# Patient Record
Sex: Female | Born: 1975 | Race: Black or African American | Hispanic: No | Marital: Single | Smoking: Never smoker
Health system: Southern US, Community
[De-identification: ages and names within clinical notes are randomized; demographics above are authoritative.]

## PROBLEM LIST (undated history)

## (undated) DIAGNOSIS — D649 Anemia, unspecified: Secondary | ICD-10-CM

## (undated) DIAGNOSIS — R51 Headache: Secondary | ICD-10-CM

## (undated) DIAGNOSIS — D500 Iron deficiency anemia secondary to blood loss (chronic): Principal | ICD-10-CM

## (undated) HISTORY — DX: Iron deficiency anemia secondary to blood loss (chronic): D50.0

## (undated) HISTORY — PX: NO PAST SURGERIES: SHX2092

---

## 2000-03-05 ENCOUNTER — Encounter: Admission: RE | Admit: 2000-03-05 | Discharge: 2000-03-05 | Payer: Self-pay | Admitting: Family Medicine

## 2000-03-05 ENCOUNTER — Encounter: Payer: Self-pay | Admitting: Family Medicine

## 2006-02-14 ENCOUNTER — Emergency Department (HOSPITAL_COMMUNITY): Admission: EM | Admit: 2006-02-14 | Discharge: 2006-02-14 | Payer: Self-pay | Admitting: Emergency Medicine

## 2007-09-13 ENCOUNTER — Ambulatory Visit: Payer: Self-pay | Admitting: Family Medicine

## 2009-07-23 ENCOUNTER — Ambulatory Visit: Payer: Self-pay | Admitting: Family Medicine

## 2009-07-23 ENCOUNTER — Other Ambulatory Visit: Admission: RE | Admit: 2009-07-23 | Discharge: 2009-07-23 | Payer: Self-pay | Admitting: Internal Medicine

## 2009-07-31 ENCOUNTER — Ambulatory Visit: Payer: Self-pay | Admitting: Family Medicine

## 2009-08-23 ENCOUNTER — Ambulatory Visit: Payer: Self-pay | Admitting: Family Medicine

## 2011-06-02 ENCOUNTER — Other Ambulatory Visit: Payer: Self-pay | Admitting: Obstetrics and Gynecology

## 2011-06-02 DIAGNOSIS — N63 Unspecified lump in unspecified breast: Secondary | ICD-10-CM

## 2011-06-10 ENCOUNTER — Ambulatory Visit
Admission: RE | Admit: 2011-06-10 | Discharge: 2011-06-10 | Disposition: A | Discharge status: Home / Self Care | Payer: Commercial Indemnity | Source: Ambulatory Visit | Attending: Obstetrics and Gynecology | Admitting: Obstetrics and Gynecology

## 2011-06-10 DIAGNOSIS — N63 Unspecified lump in unspecified breast: Secondary | ICD-10-CM

## 2011-07-16 ENCOUNTER — Encounter (HOSPITAL_COMMUNITY): Payer: Self-pay | Admitting: Pharmacist

## 2011-07-28 ENCOUNTER — Other Ambulatory Visit (HOSPITAL_COMMUNITY): Payer: Commercial Indemnity

## 2011-07-29 ENCOUNTER — Encounter (HOSPITAL_COMMUNITY): Payer: Self-pay

## 2011-07-29 ENCOUNTER — Encounter (HOSPITAL_COMMUNITY)
Admission: RE | Admit: 2011-07-29 | Discharge: 2011-07-29 | Disposition: A | Discharge status: Home / Self Care | Payer: Managed Care, Other (non HMO) | Source: Ambulatory Visit | Attending: Obstetrics and Gynecology | Admitting: Obstetrics and Gynecology

## 2011-07-29 DIAGNOSIS — Z01812 Encounter for preprocedural laboratory examination: Secondary | ICD-10-CM | POA: Insufficient documentation

## 2011-07-29 DIAGNOSIS — Z01818 Encounter for other preprocedural examination: Secondary | ICD-10-CM | POA: Insufficient documentation

## 2011-07-29 HISTORY — DX: Anemia, unspecified: D64.9

## 2011-07-29 HISTORY — DX: Headache: R51

## 2011-07-29 LAB — SURGICAL PCR SCREEN
MRSA, PCR: NEGATIVE
Staphylococcus aureus: NEGATIVE

## 2011-07-29 NOTE — Patient Instructions (Addendum)
YOUR PROCEDURE IS SCHEDULED ON:08/04/11  ENTER THROUGH THE MAIN ENTRANCE OF [facility] AT:0600 am  USE DESK PHONE AND DIAL [phone] TO INFORM us OF YOUR ARRIVAL  CALL [phone] IF YOU HAVE ANY QUESTIONS OR PROBLEMS PRIOR TO YOUR ARRIVAL.  REMEMBER: DO NOT EAT OR DRINK AFTER MIDNIGHT : Sunday  SPECIAL INSTRUCTIONS:   YOU MAY BRUSH YOUR TEETH THE MORNING OF SURGERY   TAKE THESE MEDICINES THE DAY OF SURGERY WITH SIP OF WATER:   DO NOT WEAR JEWELRY, EYE MAKEUP, LIPSTICK OR DARK FINGERNAIL POLISH DO NOT WEAR LOTIONS  DO NOT SHAVE FOR 48 HOURS PRIOR TO SURGERY  YOU WILL NOT BE ALLOWED TO DRIVE YOURSELF HOME.  NAME OF DRIVER:Cleola- mother

## 2011-07-29 NOTE — H&P (Addendum)
36 yo G0 w/ symptomatic fibroids and anemia presents for surgical mngt  PMHx:  None PSHx:  None All:  NKDA Meds:  PNV, integra, motrin prn FHx:  N/c  AF, VSS Gen - NAD CV - RRR Lungs - clear bilaterally Abd - soft, NT Ext - no edema PV - mobile NT uterus, enlarged, 18 wks size  [place] - multiple fibroids measuring 3-6cm  A/P:  Fibroids, anemia - plan for myomectomy Plan of care reviewed, informed consent

## 2011-08-06 ENCOUNTER — Telehealth: Payer: Self-pay | Admitting: Oncology

## 2011-08-06 NOTE — Telephone Encounter (Signed)
S/w amy and she stated that the pt is aware and she did confirm

## 2011-08-07 ENCOUNTER — Encounter: Payer: Self-pay | Admitting: Oncology

## 2011-08-07 ENCOUNTER — Other Ambulatory Visit: Payer: Managed Care, Other (non HMO)

## 2011-08-07 ENCOUNTER — Ambulatory Visit: Payer: Managed Care, Other (non HMO)

## 2011-08-07 ENCOUNTER — Ambulatory Visit (HOSPITAL_BASED_OUTPATIENT_CLINIC_OR_DEPARTMENT_OTHER): Payer: Managed Care, Other (non HMO)

## 2011-08-07 ENCOUNTER — Ambulatory Visit (HOSPITAL_BASED_OUTPATIENT_CLINIC_OR_DEPARTMENT_OTHER): Payer: Managed Care, Other (non HMO) | Admitting: Oncology

## 2011-08-07 ENCOUNTER — Telehealth: Payer: Self-pay | Admitting: Oncology

## 2011-08-07 VITALS — BP 147/85 | HR 82 | Temp 99.1°F | Ht 64.0 in | Wt 159.4 lb

## 2011-08-07 DIAGNOSIS — D500 Iron deficiency anemia secondary to blood loss (chronic): Secondary | ICD-10-CM

## 2011-08-07 DIAGNOSIS — R58 Hemorrhage, not elsewhere classified: Secondary | ICD-10-CM

## 2011-08-07 DIAGNOSIS — D259 Leiomyoma of uterus, unspecified: Secondary | ICD-10-CM

## 2011-08-07 HISTORY — DX: Iron deficiency anemia secondary to blood loss (chronic): D50.0

## 2011-08-07 LAB — CBC WITH DIFFERENTIAL/PLATELET
BASO%: 0.7 % (ref 0.0–2.0)
Basophils Absolute: 0.1 10*3/uL (ref 0.0–0.1)
EOS%: 2.5 % (ref 0.0–7.0)
HCT: 29.6 % — ABNORMAL LOW (ref 34.8–46.6)
HGB: 8.6 g/dL — ABNORMAL LOW (ref 11.6–15.9)
MCH: 19.2 pg — ABNORMAL LOW (ref 25.1–34.0)
MONO#: 0.3 10*3/uL (ref 0.1–0.9)
NEUT%: 57.5 % (ref 38.4–76.8)
RDW: 19.7 % — ABNORMAL HIGH (ref 11.2–14.5)
WBC: 6.7 10*3/uL (ref 3.9–10.3)
lymph#: 2.3 10*3/uL (ref 0.9–3.3)

## 2011-08-07 LAB — COMPREHENSIVE METABOLIC PANEL
AST: 16 U/L (ref 0–37)
Albumin: 4.1 g/dL (ref 3.5–5.2)
Alkaline Phosphatase: 35 U/L — ABNORMAL LOW (ref 39–117)
Calcium: 9.4 mg/dL (ref 8.4–10.5)
Chloride: 103 mEq/L (ref 96–112)
Glucose, Bld: 85 mg/dL (ref 70–99)
Potassium: 3.9 mEq/L (ref 3.5–5.3)
Sodium: 138 mEq/L (ref 135–145)
Total Protein: 8.2 g/dL (ref 6.0–8.3)

## 2011-08-07 NOTE — Telephone Encounter (Signed)
Referred by Dr. Zelphia Cairo DX- Anemia

## 2011-08-07 NOTE — Patient Instructions (Signed)
1. You will receive IV iron on 4/4 and 4/11  2. Follow up labs on 4/23  And a visit with Dr. Welton Flakes on 4/25  3. Please call with any problems

## 2011-08-07 NOTE — Progress Notes (Signed)
Patient came in today as a new patient and she has [place] insurance,I explain to her about our financial assistance program and she said sure,so I gave her an application to fill out.

## 2011-08-08 ENCOUNTER — Telehealth: Payer: Self-pay | Admitting: Oncology

## 2011-08-08 NOTE — Telephone Encounter (Signed)
lmonvm adviisng the pt of her April 2013 appt. 

## 2011-08-14 ENCOUNTER — Ambulatory Visit (HOSPITAL_BASED_OUTPATIENT_CLINIC_OR_DEPARTMENT_OTHER): Payer: Managed Care, Other (non HMO)

## 2011-08-14 VITALS — BP 127/87 | HR 75 | Temp 98.5°F

## 2011-08-14 DIAGNOSIS — D500 Iron deficiency anemia secondary to blood loss (chronic): Secondary | ICD-10-CM

## 2011-08-14 MED ORDER — SODIUM CHLORIDE 0.9 % IV SOLN
1020.0000 mg | Freq: Once | INTRAVENOUS | Status: AC
  Administered 2011-08-14: 1020 mg via INTRAVENOUS
  Filled 2011-08-14: qty 34

## 2011-08-14 MED ORDER — SODIUM CHLORIDE 0.9 % IV SOLN
Freq: Once | INTRAVENOUS | Status: AC
  Administered 2011-08-14: 50 mL via INTRAVENOUS

## 2011-08-14 MED ORDER — SODIUM CHLORIDE 0.9 % IJ SOLN
3.0000 mL | Freq: Once | INTRAMUSCULAR | Status: DC | PRN
  Filled 2011-08-14: qty 10

## 2011-08-14 NOTE — Patient Instructions (Signed)
[facility] Discharge Instructions for Patients Receiving Iron Today you received the following chemotherapy agents Feraheme   . Feel free to call the clinic you have any questions or concerns. The clinic phone number is [phone].   I have been informed and understand all the instructions given to me. I know to contact the clinic, my physician, or go to the Emergency Department if any problems should occur. I do not have any questions at this time, but understand that I may call the clinic during office hours   should I have any questions or need assistance in obtaining follow up care.    __________________________________________  _____________  __________ Signature of Patient or Authorized Representative            Date                   Time    __________________________________________ Nurse's Signature

## 2011-08-19 NOTE — Progress Notes (Signed)
CAASI GIGLIA [id number] Oct 15, 1975 35 y.o. 08/19/2011 6:36 PM  CC Dr. Zelphia Cairo  REASON FOR CONSULTATION:  36 year old with iron deficiency anemia   REFERRING PHYSICIAN: Dr. Zelphia Cairo  HISTORY OF PRESENT ILLNESS:  VERONCIA Jordan is a 36 y.o. female With medical history significant for headaches and iron deficiency anemia due to chronic blood loss from fibroid of the uterus. Patient recently was seen by Dr. Esperanza Richters for possibility of a myomectomy do to large uterine fibroids.Patient had a CBC performed at that time and she was found to be severely anemic with a hemoglobin of 8.4 hematocrit 27.6 MCV was 62 platelets were normal at 352. She went on to have iron studies performed and the ferritin was 2. Clinically patient does complain of having some fatigue but otherwise is asymptomatic. Patient is referred to hematology for consideration of parenteral iron  Past Medical History  Diagnosis Date  . Anemia   . Headache   . Iron deficiency anemia due to chronic blood loss 08/07/2011    Past Surgical History  Procedure Date  . No past surgeries     Family History  Problem Relation Age of Onset  . Anemia Mother     Social History History  Substance Use Topics  . Smoking status: Never Smoker   . Smokeless tobacco: Never Used  . Alcohol Use: 0.6 oz/week    1 Glasses of wine per week    No Known Allergies  Current Outpatient Prescriptions  Medication Sig Dispense Refill  . ibuprofen (ADVIL,MOTRIN) 200 MG tablet Take 400 mg by mouth every 6 (six) hours as needed. For pain      . Prenatal Vit-Fe Fumarate-FA (PRENATAL MULTIVITAMIN) TABS Take 1 tablet by mouth daily.        REVIEW OF SYSTEMS:  Constitutional: positive for fatigue and pica Ears, nose, mouth, throat, and face: positive for pale oral mucosa Respiratory: negative Cardiovascular: negative Gastrointestinal: negative Genitourinary:negative Hematologic/lymphatic:  negative Musculoskeletal:negative Neurological: negative  ECOG performance Status: 0  PHYSICAL EXAMINATION: Blood pressure 147/85, pulse 82, temperature 99.1 F (37.3 C), temperature source Oral, height 5\' 4"  (1.626 m), weight 159 lb 6.4 oz (72.303 kg), last menstrual period 07/01/2011. YQM:VHQIO, healthy, no distress, comfortable, cooperative and pale SKIN: positive for: pallor HEAD: Normocephalic EYES: PERRLA, EOMI, pale conjunctiva, sclera anicteric EARS: External ears normal OROPHARYNX:no exudate, no erythema and dentition normal  NECK: supple, no adenopathy LYMPH:  no palpable lymphadenopathy BREAST:not examined LUNGS: clear to auscultation and percussion HEART: regular rate & rhythm, no murmurs and no gallops ABDOMEN:abdomen soft, non-tender, normal bowel sounds and no masses or organomegaly BACK: Back symmetric, no curvature., No CVA tenderness EXTREMITIES:no edema, no clubbing, no cyanosis  NEURO: alert & oriented x 3 with fluent speech, gait normal, reflexes normal and symmetric    ASSESSMENT    36 year old female with  #1 iron deficiency anemia due to uterine fibroids and chronic bleeding. Patient has been on oral iron off-and-on but her ferritin still is quite low. Blood work performed at her gynecologist's office showed a ferritin of only 2. This is quite low and is not enough to replenish her stores orally. Therefore recommendation is made for parenteral iron. Patient at this time does not need a blood transfusion since she seems to be relatively asymptomatic from her anemia.  #2 patient is scheduled for myomectomy in April 2013.    PLAN:    #1 I will check her iron levels including TIBC and total iron.  #2 we will proceed with  getting her parenteral iron in the form of feraHeme 1020 mg intravenously.  #3 after she has had this we will recheck her iron stores to see how well we have done and we will also check a CBC. It may be that she may require this again.      All questions were answered. The patient knows to call the clinic with any problems, questions or concerns. We can certainly see the patient much sooner if necessary.  Thank you so much for allowing me to participate in the care of Christine Jordan. I will continue to follow up the patient with you and assist in her care.  I spent 60 minutes counseling the patient face to face. The total time spent in the appointment was 60 minutes. Drue Second, MD Medical/Oncology [facility] [phone] (beeper) [phone] (Office)  08/19/2011, 6:36 PM 08/19/2011, 6:36 PM

## 2011-08-20 ENCOUNTER — Encounter: Payer: Self-pay | Admitting: *Deleted

## 2011-08-20 ENCOUNTER — Telehealth: Payer: Self-pay | Admitting: *Deleted

## 2011-08-20 NOTE — Telephone Encounter (Signed)
per orders from 08-20-2011 cancelled 08-21-2011 patient's father confirmed the appointment has been cancelled 

## 2011-08-20 NOTE — Telephone Encounter (Signed)
per orders from 08-20-2011 cancelled 08-21-2011 patient's father confirmed the appointment has been cancelled

## 2011-08-21 ENCOUNTER — Ambulatory Visit: Payer: Managed Care, Other (non HMO)

## 2011-08-26 ENCOUNTER — Encounter: Payer: Self-pay | Admitting: Oncology

## 2011-08-26 NOTE — Progress Notes (Signed)
Patient approve for 20% Discount  08/26/11 - 02/25/12.

## 2011-09-02 ENCOUNTER — Other Ambulatory Visit (HOSPITAL_BASED_OUTPATIENT_CLINIC_OR_DEPARTMENT_OTHER): Payer: Managed Care, Other (non HMO) | Admitting: Lab

## 2011-09-02 DIAGNOSIS — D500 Iron deficiency anemia secondary to blood loss (chronic): Secondary | ICD-10-CM

## 2011-09-02 LAB — CBC WITH DIFFERENTIAL/PLATELET
Basophils Absolute: 0.1 10*3/uL (ref 0.0–0.1)
Eosinophils Absolute: 0.2 10*3/uL (ref 0.0–0.5)
HCT: 33.1 % — ABNORMAL LOW (ref 34.8–46.6)
LYMPH%: 33.9 % (ref 14.0–49.7)
MCV: 72.8 fL — ABNORMAL LOW (ref 79.5–101.0)
MONO#: 0.4 10*3/uL (ref 0.1–0.9)
NEUT#: 3 10*3/uL (ref 1.5–6.5)
NEUT%: 54.1 % (ref 38.4–76.8)
Platelets: 304 10*3/uL (ref 145–400)
WBC: 5.6 10*3/uL (ref 3.9–10.3)

## 2011-09-02 LAB — IRON AND TIBC
%SAT: 25 % (ref 20–55)
TIBC: 343 ug/dL (ref 250–470)
UIBC: 258 ug/dL (ref 125–400)

## 2011-09-04 ENCOUNTER — Telehealth: Payer: Self-pay | Admitting: Oncology

## 2011-09-04 ENCOUNTER — Ambulatory Visit (HOSPITAL_BASED_OUTPATIENT_CLINIC_OR_DEPARTMENT_OTHER): Payer: Managed Care, Other (non HMO) | Admitting: Oncology

## 2011-09-04 ENCOUNTER — Encounter: Payer: Self-pay | Admitting: Oncology

## 2011-09-04 VITALS — BP 130/87 | HR 65 | Temp 98.3°F | Ht 64.0 in | Wt 160.7 lb

## 2011-09-04 DIAGNOSIS — D500 Iron deficiency anemia secondary to blood loss (chronic): Secondary | ICD-10-CM

## 2011-09-04 NOTE — Patient Instructions (Signed)
1. Your counts have risen very nicely after the iron infusion.  2. I will see you back in 2 months with labs prior to your office visit with me.

## 2011-09-04 NOTE — Progress Notes (Signed)
OFFICE PROGRESS NOTE  CC  [name], MD, MD [address] [place] [address] Dr. Zelphia Cairo  DIAGNOSIS: 36 year old female with iron deficiency anemia Due to chronic blood loss  PRIOR THERAPY:  #1 patient is status post IV iron infusion with Fara heme in early April 2013.  CURRENT THERAPY:Surveillance  INTERVAL HISTORY: Christine Jordan 36 y.o. female returns for Followup visit. Overall she is doing well. She in fact tells me that since she has had her iron infusion she has stopped eating ice and she has more energy. She normally sleeps during the weekend that she is so tired and this weekend she has not been sleeping at all and doing a lot of work around the house. She denies any headaches double vision blurring of vision no shortness of breath no chest pains no palpitation she has not noticed any hematuria hematochezia melena hemoptysis hematemesis. She has no myalgias or arthralgias. Remainder of the 10 point review of systems is negative.  MEDICAL HISTORY: Past Medical History  Diagnosis Date  . Anemia   . Headache   . Iron deficiency anemia due to chronic blood loss 08/07/2011    ALLERGIES:   has no known allergies.  MEDICATIONS:  Current Outpatient Prescriptions  Medication Sig Dispense Refill  . ibuprofen (ADVIL,MOTRIN) 200 MG tablet Take 400 mg by mouth every 6 (six) hours as needed. For pain      . Prenatal Vit-Fe Fumarate-FA (PRENATAL MULTIVITAMIN) TABS Take 1 tablet by mouth daily.        SURGICAL HISTORY:  Past Surgical History  Procedure Date  . No past surgeries     REVIEW OF SYSTEMS:  Pertinent items are noted in HPI.   PHYSICAL EXAMINATION: General appearance: alert, cooperative and appears stated age Neck: no adenopathy, no carotid bruit, no JVD, supple, symmetrical, trachea midline and thyroid not enlarged, symmetric, no tenderness/mass/nodules Lymph nodes: Cervical, supraclavicular, and axillary nodes normal. Resp: clear to  auscultation bilaterally and normal percussion bilaterally Back: symmetric, no curvature. ROM normal. No CVA tenderness. Cardio: regular rate and rhythm, S1, S2 normal, no murmur, click, rub or gallop GI: soft, non-tender; bowel sounds normal; no masses,  no organomegaly Extremities: extremities normal, atraumatic, no cyanosis or edema Neurologic: Grossly normal  ECOG PERFORMANCE STATUS: 0 - Asymptomatic  Blood pressure 130/87, pulse 65, temperature 98.3 F (36.8 C), temperature source Oral, height 5\' 4"  (1.626 m), weight 160 lb 11.2 oz (72.893 kg).  LABORATORY DATA: Lab Results  Component Value Date   WBC 5.6 09/02/2011   HGB 10.0* 09/02/2011   HCT 33.1* 09/02/2011   MCV 72.8* 09/02/2011   PLT 304 09/02/2011      Chemistry      Component Value Date/Time   NA 138 08/07/2011 1453   K 3.9 08/07/2011 1453   CL 103 08/07/2011 1453   CO2 27 08/07/2011 1453   BUN 10 08/07/2011 1453   CREATININE 0.74 08/07/2011 1453      Component Value Date/Time   CALCIUM 9.4 08/07/2011 1453   ALKPHOS 35* 08/07/2011 1453   AST 16 08/07/2011 1453   ALT 8 08/07/2011 1453   BILITOT 0.2* 08/07/2011 1453       RADIOGRAPHIC STUDIES:  No results found.  ASSESSMENT: 36 year old female with iron deficiency anemia due to chronic blood loss. Patient is now status post parenteral iron. Overall she tolerated it well. Clinically she feels much better. Her blood counts have risen very nicely her ferritin is 244. Her hemoglobin has risen to 10 which  is significantly improved from 8.4.   PLAN: We will continue to follow the patient in mind recommendation at this time is for her to be seen back in about 2 months time we will do another iron panel and I will see her. I did discuss with her that it may be that she'll need parenteral iron in the future as well. Patient demonstrated understanding and is very much in favor of this since the first infusion has helped her clinically so much.   All questions were answered. The  patient knows to call the clinic with any problems, questions or concerns. We can certainly see the patient much sooner if necessary.  I spent 20 minutes counseling the patient face to face. The total time spent in the appointment was 30 minutes.    Drue Second, MD Medical/Oncology [facility] [phone] (beeper) [phone] (Office)  09/04/2011, 1:59 PM

## 2011-09-04 NOTE — Telephone Encounter (Signed)
gve the pt her July 2013 appt calendar °

## 2011-09-08 ENCOUNTER — Encounter (HOSPITAL_COMMUNITY)
Admission: RE | Admit: 2011-09-08 | Discharge: 2011-09-08 | Disposition: A | Discharge status: Home / Self Care | Payer: Managed Care, Other (non HMO) | Source: Ambulatory Visit | Attending: Obstetrics and Gynecology | Admitting: Obstetrics and Gynecology

## 2011-09-08 LAB — SURGICAL PCR SCREEN
MRSA, PCR: NEGATIVE
Staphylococcus aureus: NEGATIVE

## 2011-09-08 NOTE — Patient Instructions (Addendum)
YOUR PROCEDURE IS SCHEDULED ON:09/15/11  ENTER THROUGH THE MAIN ENTRANCE OF [facility] AT:6am  USE DESK PHONE AND DIAL [phone] TO INFORM us OF YOUR ARRIVAL  CALL [phone] IF YOU HAVE ANY QUESTIONS OR PROBLEMS PRIOR TO YOUR ARRIVAL.  REMEMBER: DO NOT EAT OR DRINK AFTER MIDNIGHT : Sunday  SPECIAL INSTRUCTIONS:   YOU MAY BRUSH YOUR TEETH THE MORNING OF SURGERY   TAKE THESE MEDICINES THE DAY OF SURGERY WITH SIP OF WATER:none   DO NOT WEAR JEWELRY, EYE MAKEUP, LIPSTICK OR DARK FINGERNAIL POLISH DO NOT WEAR LOTIONS  DO NOT SHAVE FOR 48 HOURS PRIOR TO SURGERY  YOU WILL NOT BE ALLOWED TO DRIVE YOURSELF HOME.  NAME OF DRIVER: Creola , mother

## 2011-09-14 MED ORDER — DEXTROSE 5 % IV SOLN
1.0000 g | INTRAVENOUS | Status: AC
  Administered 2011-09-15: 1 g via INTRAVENOUS
  Filled 2011-09-14: qty 1

## 2011-09-15 ENCOUNTER — Inpatient Hospital Stay (HOSPITAL_COMMUNITY)
Admission: RE | Admit: 2011-09-15 | Discharge: 2011-09-17 | DRG: 743 | Disposition: A | Discharge status: Home / Self Care | Payer: Managed Care, Other (non HMO) | Source: Ambulatory Visit | Attending: Obstetrics and Gynecology | Admitting: Obstetrics and Gynecology

## 2011-09-15 ENCOUNTER — Encounter (HOSPITAL_COMMUNITY): Payer: Self-pay | Admitting: Anesthesiology

## 2011-09-15 ENCOUNTER — Encounter (HOSPITAL_COMMUNITY): Payer: Self-pay | Admitting: *Deleted

## 2011-09-15 ENCOUNTER — Encounter (HOSPITAL_COMMUNITY)
Admission: RE | Disposition: A | Discharge status: Home / Self Care | Payer: Self-pay | Source: Ambulatory Visit | Attending: Obstetrics and Gynecology

## 2011-09-15 ENCOUNTER — Inpatient Hospital Stay (HOSPITAL_COMMUNITY): Payer: Managed Care, Other (non HMO) | Admitting: Anesthesiology

## 2011-09-15 DIAGNOSIS — Z01818 Encounter for other preprocedural examination: Secondary | ICD-10-CM

## 2011-09-15 DIAGNOSIS — D259 Leiomyoma of uterus, unspecified: Principal | ICD-10-CM | POA: Diagnosis present

## 2011-09-15 DIAGNOSIS — D649 Anemia, unspecified: Secondary | ICD-10-CM | POA: Diagnosis present

## 2011-09-15 DIAGNOSIS — Z01812 Encounter for preprocedural laboratory examination: Secondary | ICD-10-CM

## 2011-09-15 HISTORY — PX: MYOMECTOMY: SHX85

## 2011-09-15 LAB — URINALYSIS, ROUTINE W REFLEX MICROSCOPIC
Bilirubin Urine: NEGATIVE
Ketones, ur: NEGATIVE mg/dL
Nitrite: NEGATIVE
Protein, ur: NEGATIVE mg/dL
pH: 7 (ref 5.0–8.0)

## 2011-09-15 LAB — TYPE AND SCREEN: Antibody Screen: NEGATIVE

## 2011-09-15 SURGERY — MYOMECTOMY, ABDOMINAL APPROACH
Anesthesia: Epidural | Wound class: Clean

## 2011-09-15 MED ORDER — KETOROLAC TROMETHAMINE 60 MG/2ML IM SOLN
60.0000 mg | Freq: Once | INTRAMUSCULAR | Status: AC | PRN
  Filled 2011-09-15: qty 2

## 2011-09-15 MED ORDER — DIPHENHYDRAMINE HCL 25 MG PO CAPS: 25.0000 mg | ORAL_CAPSULE | ORAL | Status: DC | PRN

## 2011-09-15 MED ORDER — MEPERIDINE HCL 25 MG/ML IJ SOLN: 6.2500 mg | INTRAMUSCULAR | Status: DC | PRN

## 2011-09-15 MED ORDER — ONDANSETRON HCL 4 MG PO TABS: 4.0000 mg | ORAL_TABLET | Freq: Four times a day (QID) | ORAL | Status: DC | PRN

## 2011-09-15 MED ORDER — ONDANSETRON HCL 4 MG/2ML IJ SOLN
INTRAMUSCULAR | Status: DC | PRN
  Administered 2011-09-15 (×2): 4 mg via INTRAVENOUS

## 2011-09-15 MED ORDER — BUPIVACAINE IN DEXTROSE 0.75-8.25 % IT SOLN
INTRATHECAL | Status: DC | PRN
  Administered 2011-09-15: 2 mL via INTRATHECAL

## 2011-09-15 MED ORDER — MORPHINE SULFATE 0.5 MG/ML IJ SOLN
INTRAMUSCULAR | Status: AC
  Filled 2011-09-15: qty 10

## 2011-09-15 MED ORDER — NALBUPHINE HCL 10 MG/ML IJ SOLN: 5.0000 mg | INTRAMUSCULAR | Status: DC | PRN

## 2011-09-15 MED ORDER — NALOXONE HCL 0.4 MG/ML IJ SOLN: 0.4000 mg | INTRAMUSCULAR | Status: DC | PRN

## 2011-09-15 MED ORDER — PHENYLEPHRINE 40 MCG/ML (10ML) SYRINGE FOR IV PUSH (FOR BLOOD PRESSURE SUPPORT)
PREFILLED_SYRINGE | INTRAVENOUS | Status: AC
  Filled 2011-09-15: qty 5

## 2011-09-15 MED ORDER — ONDANSETRON HCL 4 MG/2ML IJ SOLN: 4.0000 mg | Freq: Three times a day (TID) | INTRAMUSCULAR | Status: DC | PRN

## 2011-09-15 MED ORDER — KCL-LACTATED RINGERS-D5W 20 MEQ/L IV SOLN
INTRAVENOUS | Status: DC
  Administered 2011-09-15 – 2011-09-16 (×3): via INTRAVENOUS
  Filled 2011-09-15 (×7): qty 1000

## 2011-09-15 MED ORDER — ONDANSETRON HCL 4 MG/2ML IJ SOLN
INTRAMUSCULAR | Status: AC
  Filled 2011-09-15: qty 2

## 2011-09-15 MED ORDER — 0.9 % SODIUM CHLORIDE (POUR BTL) OPTIME
TOPICAL | Status: DC | PRN
  Administered 2011-09-15: 2000 mL

## 2011-09-15 MED ORDER — PROPOFOL 10 MG/ML IV EMUL
INTRAVENOUS | Status: DC | PRN
  Administered 2011-09-15: 25 ug/kg/min via INTRAVENOUS

## 2011-09-15 MED ORDER — LACTATED RINGERS IV SOLN
INTRAVENOUS | Status: DC
  Administered 2011-09-15 (×4): via INTRAVENOUS

## 2011-09-15 MED ORDER — LIDOCAINE-EPINEPHRINE (PF) 2 %-1:200000 IJ SOLN
INTRAMUSCULAR | Status: AC
  Filled 2011-09-15: qty 20

## 2011-09-15 MED ORDER — PROMETHAZINE HCL 25 MG/ML IJ SOLN
INTRAMUSCULAR | Status: AC
  Administered 2011-09-15: 6.25 mg via INTRAVENOUS
  Filled 2011-09-15: qty 1

## 2011-09-15 MED ORDER — PROPOFOL 10 MG/ML IV EMUL
INTRAVENOUS | Status: DC | PRN
  Administered 2011-09-15: 10 mg via INTRAVENOUS

## 2011-09-15 MED ORDER — VASOPRESSIN 20 UNIT/ML IJ SOLN
INTRAMUSCULAR | Status: AC
  Filled 2011-09-15: qty 1

## 2011-09-15 MED ORDER — PROPOFOL 10 MG/ML IV EMUL
INTRAVENOUS | Status: AC
  Filled 2011-09-15: qty 20

## 2011-09-15 MED ORDER — DIPHENHYDRAMINE HCL 50 MG/ML IJ SOLN: 25.0000 mg | INTRAMUSCULAR | Status: DC | PRN

## 2011-09-15 MED ORDER — ONDANSETRON HCL 4 MG/2ML IJ SOLN: 4.0000 mg | Freq: Four times a day (QID) | INTRAMUSCULAR | Status: DC | PRN

## 2011-09-15 MED ORDER — OXYCODONE-ACETAMINOPHEN 5-325 MG PO TABS
1.0000 | ORAL_TABLET | ORAL | Status: DC | PRN
  Administered 2011-09-16 – 2011-09-17 (×4): 1 via ORAL
  Filled 2011-09-15 (×4): qty 1

## 2011-09-15 MED ORDER — METOCLOPRAMIDE HCL 5 MG/ML IJ SOLN
INTRAMUSCULAR | Status: AC
  Administered 2011-09-15: 10 mg via INTRAVENOUS
  Filled 2011-09-15: qty 2

## 2011-09-15 MED ORDER — NALBUPHINE HCL 10 MG/ML IJ SOLN
5.0000 mg | INTRAMUSCULAR | Status: DC | PRN
  Filled 2011-09-15: qty 1

## 2011-09-15 MED ORDER — MORPHINE SULFATE (PF) 0.5 MG/ML IJ SOLN
INTRAMUSCULAR | Status: DC | PRN
  Administered 2011-09-15: .2 mg via INTRATHECAL

## 2011-09-15 MED ORDER — DIPHENHYDRAMINE HCL 50 MG/ML IJ SOLN: 12.5000 mg | INTRAMUSCULAR | Status: DC | PRN

## 2011-09-15 MED ORDER — SODIUM CHLORIDE 0.9 % IJ SOLN: 3.0000 mL | INTRAMUSCULAR | Status: DC | PRN

## 2011-09-15 MED ORDER — FENTANYL CITRATE 0.05 MG/ML IJ SOLN: 25.0000 ug | INTRAMUSCULAR | Status: DC | PRN

## 2011-09-15 MED ORDER — ONDANSETRON HCL 4 MG/2ML IJ SOLN
4.0000 mg | Freq: Three times a day (TID) | INTRAMUSCULAR | Status: DC | PRN
  Administered 2011-09-15: 4 mg via INTRAVENOUS
  Filled 2011-09-15: qty 2

## 2011-09-15 MED ORDER — VASOPRESSIN 20 UNIT/ML IJ SOLN
INTRAVENOUS | Status: DC | PRN
  Administered 2011-09-15 (×3): via INTRAMUSCULAR

## 2011-09-15 MED ORDER — SODIUM BICARBONATE 8.4 % IV SOLN
INTRAVENOUS | Status: AC
  Filled 2011-09-15: qty 50

## 2011-09-15 MED ORDER — NALOXONE HCL 0.4 MG/ML IJ SOLN
1.0000 ug/kg/h | INTRAMUSCULAR | Status: DC | PRN
Start: 2011-09-15 — End: 2011-09-15

## 2011-09-15 MED ORDER — DEXAMETHASONE SODIUM PHOSPHATE 10 MG/ML IJ SOLN
INTRAMUSCULAR | Status: AC
  Filled 2011-09-15: qty 1

## 2011-09-15 MED ORDER — FENTANYL CITRATE 0.05 MG/ML IJ SOLN
INTRAMUSCULAR | Status: AC
  Filled 2011-09-15: qty 5

## 2011-09-15 MED ORDER — BUPIVACAINE IN DEXTROSE 0.75-8.25 % IT SOLN
INTRATHECAL | Status: AC
  Filled 2011-09-15: qty 2

## 2011-09-15 MED ORDER — PROMETHAZINE HCL 25 MG/ML IJ SOLN
6.2500 mg | INTRAMUSCULAR | Status: DC | PRN
  Administered 2011-09-15: 6.25 mg via INTRAVENOUS

## 2011-09-15 MED ORDER — MENTHOL 3 MG MT LOZG: 1.0000 | LOZENGE | OROMUCOSAL | Status: DC | PRN

## 2011-09-15 MED ORDER — KETOROLAC TROMETHAMINE 60 MG/2ML IM SOLN: 60.0000 mg | Freq: Once | INTRAMUSCULAR | Status: DC | PRN

## 2011-09-15 MED ORDER — SCOPOLAMINE 1 MG/3DAYS TD PT72
1.0000 | MEDICATED_PATCH | Freq: Once | TRANSDERMAL | Status: DC
  Filled 2011-09-15: qty 1

## 2011-09-15 MED ORDER — METOCLOPRAMIDE HCL 5 MG/ML IJ SOLN
10.0000 mg | Freq: Three times a day (TID) | INTRAMUSCULAR | Status: DC | PRN
  Administered 2011-09-15: 10 mg via INTRAVENOUS

## 2011-09-15 MED ORDER — KETOROLAC TROMETHAMINE 30 MG/ML IJ SOLN: 30.0000 mg | Freq: Four times a day (QID) | INTRAMUSCULAR | Status: AC | PRN

## 2011-09-15 MED ORDER — MIDAZOLAM HCL 2 MG/2ML IJ SOLN
INTRAMUSCULAR | Status: AC
  Filled 2011-09-15: qty 2

## 2011-09-15 MED ORDER — KETOROLAC TROMETHAMINE 30 MG/ML IJ SOLN: 30.0000 mg | Freq: Four times a day (QID) | INTRAMUSCULAR | Status: DC | PRN

## 2011-09-15 MED ORDER — FENTANYL CITRATE 0.05 MG/ML IJ SOLN
INTRAMUSCULAR | Status: DC | PRN
  Administered 2011-09-15: 50 ug via INTRAVENOUS
  Administered 2011-09-15: 25 ug via INTRATHECAL
  Administered 2011-09-15 (×3): 50 ug via INTRAVENOUS

## 2011-09-15 MED ORDER — DEXAMETHASONE SODIUM PHOSPHATE 10 MG/ML IJ SOLN
INTRAMUSCULAR | Status: DC | PRN
  Administered 2011-09-15: 10 mg via INTRAVENOUS

## 2011-09-15 MED ORDER — SODIUM BICARBONATE 8.4 % IV SOLN
INTRAVENOUS | Status: DC | PRN
  Administered 2011-09-15: 3 mL via EPIDURAL

## 2011-09-15 MED ORDER — SODIUM CHLORIDE 0.9 % IV SOLN
1.0000 ug/kg/h | INTRAVENOUS | Status: DC | PRN
  Filled 2011-09-15: qty 2.5

## 2011-09-15 MED ORDER — PRENATAL MULTIVITAMIN CH
1.0000 | ORAL_TABLET | Freq: Every day | ORAL | Status: DC
  Administered 2011-09-16 – 2011-09-17 (×2): 1 via ORAL
  Filled 2011-09-15 (×2): qty 1

## 2011-09-15 MED ORDER — SCOPOLAMINE 1 MG/3DAYS TD PT72
1.0000 | MEDICATED_PATCH | Freq: Once | TRANSDERMAL | Status: DC
  Administered 2011-09-15: 1.5 mg via TRANSDERMAL

## 2011-09-15 MED ORDER — PHENYLEPHRINE HCL 10 MG/ML IJ SOLN
INTRAMUSCULAR | Status: DC | PRN
  Administered 2011-09-15: 4 ug via INTRAVENOUS
  Administered 2011-09-15: 40 ug via INTRAVENOUS
  Administered 2011-09-15: 80 ug via INTRAVENOUS
  Administered 2011-09-15: 40 ug via INTRAVENOUS
  Administered 2011-09-15: 80 ug via INTRAVENOUS
  Administered 2011-09-15 (×2): 40 ug via INTRAVENOUS

## 2011-09-15 MED ORDER — SCOPOLAMINE 1 MG/3DAYS TD PT72
MEDICATED_PATCH | TRANSDERMAL | Status: AC
  Administered 2011-09-15: 1.5 mg via TRANSDERMAL
  Filled 2011-09-15: qty 1

## 2011-09-15 MED ORDER — MIDAZOLAM HCL 5 MG/5ML IJ SOLN
INTRAMUSCULAR | Status: DC | PRN
  Administered 2011-09-15 (×2): 1 mg via INTRAVENOUS

## 2011-09-15 MED ORDER — IBUPROFEN 600 MG PO TABS
600.0000 mg | ORAL_TABLET | Freq: Four times a day (QID) | ORAL | Status: DC | PRN
  Administered 2011-09-16 – 2011-09-17 (×3): 600 mg via ORAL
  Filled 2011-09-15 (×3): qty 1

## 2011-09-15 MED ORDER — KETOROLAC TROMETHAMINE 30 MG/ML IJ SOLN: 15.0000 mg | Freq: Once | INTRAMUSCULAR | Status: DC | PRN

## 2011-09-15 MED ORDER — METOCLOPRAMIDE HCL 5 MG/ML IJ SOLN: 10.0000 mg | Freq: Three times a day (TID) | INTRAMUSCULAR | Status: DC | PRN

## 2011-09-15 MED ORDER — SODIUM CHLORIDE 0.9 % IV SOLN
INTRAVENOUS | Status: DC
  Administered 2011-09-15: 10 mL/h via EPIDURAL
  Administered 2011-09-15: 23:00:00 via EPIDURAL
  Filled 2011-09-15 (×13): qty 12.5

## 2011-09-15 MED ORDER — LIDOCAINE HCL (CARDIAC) 20 MG/ML IV SOLN
INTRAVENOUS | Status: AC
  Filled 2011-09-15: qty 5

## 2011-09-15 SURGICAL SUPPLY — 42 items
ADH SKN CLS APL DERMABOND .7 (GAUZE/BANDAGES/DRESSINGS) ×1
BARRIER ADHS 3X4 INTERCEED (GAUZE/BANDAGES/DRESSINGS) IMPLANT
BRR ADH 4X3 ABS CNTRL BYND (GAUZE/BANDAGES/DRESSINGS)
BRR ADH 6X5 SEPRAFILM 1 SHT (MISCELLANEOUS) ×1
CANISTER SUCTION 2500CC (MISCELLANEOUS) ×2 IMPLANT
CHLORAPREP W/TINT 26ML (MISCELLANEOUS) ×4 IMPLANT
CLOTH BEACON ORANGE TIMEOUT ST (SAFETY) ×2 IMPLANT
CONT PATH 16OZ SNAP LID 3702 (MISCELLANEOUS) ×2 IMPLANT
DERMABOND ADVANCED (GAUZE/BANDAGES/DRESSINGS) ×1
DERMABOND ADVANCED .7 DNX12 (GAUZE/BANDAGES/DRESSINGS) IMPLANT
DRAPE CESAREAN BIRTH W POUCH (DRAPES) ×2 IMPLANT
GLOVE BIO SURGEON STRL SZ 6.5 (GLOVE) ×2 IMPLANT
GLOVE BIO SURGEON STRL SZ7 (GLOVE) ×2 IMPLANT
GLOVE BIOGEL PI IND STRL 7.0 (GLOVE) ×2 IMPLANT
GLOVE BIOGEL PI INDICATOR 7.0 (GLOVE) ×2
GLOVE SURG SS PI 7.0 STRL IVOR (GLOVE) ×2 IMPLANT
GOWN PREVENTION PLUS LG XLONG (DISPOSABLE) ×6 IMPLANT
HEMOSTAT SURGICEL 4X8 (HEMOSTASIS) IMPLANT
NS IRRIG 1000ML POUR BTL (IV SOLUTION) ×4 IMPLANT
PACK ABDOMINAL GYN (CUSTOM PROCEDURE TRAY) ×2 IMPLANT
PAD OB MATERNITY 4.3X12.25 (PERSONAL CARE ITEMS) ×2 IMPLANT
SEPRAFILM MEMBRANE 5X6 (MISCELLANEOUS) ×1 IMPLANT
SPONGE LAP 18X18 X RAY DECT (DISPOSABLE) ×3 IMPLANT
STAPLER VISISTAT 35W (STAPLE) ×2 IMPLANT
SUT CHROMIC 0 CT 1 (SUTURE) IMPLANT
SUT MNCRL AB 0 CT1 27 (SUTURE) ×1 IMPLANT
SUT PDS AB 0 CT1 27 (SUTURE) IMPLANT
SUT PDS AB 0 CTX 60 (SUTURE) ×1 IMPLANT
SUT PDS AB 3-0 SH 27 (SUTURE) IMPLANT
SUT PLAIN 2 0 XLH (SUTURE) IMPLANT
SUT VIC AB 0 CT1 18XCR BRD8 (SUTURE) ×3 IMPLANT
SUT VIC AB 0 CT1 27 (SUTURE) ×10
SUT VIC AB 0 CT1 27XBRD ANBCTR (SUTURE) IMPLANT
SUT VIC AB 0 CT1 8-18 (SUTURE) ×4
SUT VIC AB 0 CTX 36 (SUTURE) ×2
SUT VIC AB 0 CTX36XBRD ANBCTRL (SUTURE) IMPLANT
SUT VIC AB 2-0 CT1 (SUTURE) IMPLANT
SUT VICRYL 0 TIES 12 18 (SUTURE) IMPLANT
TOWEL OR 17X24 6PK STRL BLUE (TOWEL DISPOSABLE) ×5 IMPLANT
TRAY FOLEY CATH 14FR (SET/KITS/TRAYS/PACK) ×2 IMPLANT
WATER STERILE IRR 1000ML POUR (IV SOLUTION) ×2 IMPLANT
YANKAUER SUCT BULB TIP NO VENT (SUCTIONS) ×1 IMPLANT

## 2011-09-15 NOTE — Anesthesia Preprocedure Evaluation (Signed)
Anesthesia Evaluation  Patient identified by MRN, date of birth, ID band Patient awake    Reviewed: Allergy & Precautions, H&P , NPO status , Patient's Chart, lab work & pertinent test results, reviewed documented beta blocker date and time   Airway Mallampati: I TM Distance: >3 FB Neck ROM: full    Dental No notable dental hx. (+) Teeth Intact   Pulmonary neg pulmonary ROS,    Pulmonary exam normal       Cardiovascular negative cardio ROS      Neuro/Psych negative psych ROS   GI/Hepatic negative GI ROS, Neg liver ROS,   Endo/Other  negative endocrine ROS  Renal/GU negative Renal ROS  negative genitourinary   Musculoskeletal negative musculoskeletal ROS (+)   Abdominal Normal abdominal exam  (+)   Peds negative pediatric ROS (+)  Hematology negative hematology ROS (+)   Anesthesia Other Findings   Reproductive/Obstetrics negative OB ROS                           Anesthesia Physical Anesthesia Plan  ASA: II  Anesthesia Plan: Spinal and Epidural   Post-op Pain Management:    Induction:   Airway Management Planned:   Additional Equipment:   Intra-op Plan:   Post-operative Plan:   Informed Consent: I have reviewed the patients History and Physical, chart, labs and discussed the procedure including the risks, benefits and alternatives for the proposed anesthesia with the patient or authorized representative who has indicated his/her understanding and acceptance.     Plan Discussed with: CRNA and Surgeon  Anesthesia Plan Comments:         Anesthesia Quick Evaluation

## 2011-09-15 NOTE — Progress Notes (Signed)
Day of Surgery Procedure(s) (LRB): MYOMECTOMY (N/A)  Subjective: Patient reports nausea, incisional pain and no problems voiding.    Objective: I have reviewed patient's vital signs, intake and output, medications and labs.  Gen - NAD Abd - softly distended, appropriately tender Ext - NT, SCD in place  Assessment: s/p Procedure(s) (LRB): MYOMECTOMY (N/A): stable and progressing well  Plan: Continue postop care  LOS: 0 days    Loletta Harper 09/15/2011, 5:25 PM

## 2011-09-15 NOTE — Anesthesia Postprocedure Evaluation (Signed)
Anesthesia Post Note  Patient: Christine Jordan  Procedure(s) Performed: Procedure(s) (LRB): MYOMECTOMY (N/A)  Anesthesia type: CSE  Patient location: PACU  Post pain: Pain level controlled  Post assessment: Post-op Vital signs reviewed  Last Vitals:  Filed Vitals:   09/15/11 1215  BP: 95/83  Pulse: 78  Temp: 36.3 C  Resp: 16    Post vital signs: Reviewed  Level of consciousness: awake  Complications: No apparent anesthesia complications

## 2011-09-15 NOTE — Anesthesia Procedure Notes (Signed)
Spinal  Patient location during procedure: OR Start time: 09/15/2011 7:35 AM End time: 09/15/2011 7:41 AM Staffing Anesthesiologist: Sandrea Hughs Preanesthetic Checklist Completed: patient identified, site marked, surgical consent, pre-op evaluation, timeout performed, IV checked, risks and benefits discussed and monitors and equipment checked Spinal Block Patient position: sitting Prep: DuraPrep Patient monitoring: cardiac monitor, continuous pulse ox, blood pressure and heart rate Approach: midline Location: L3-4 Injection technique: catheter Needle Needle type: Tuohy and Sprotte  Needle gauge: 24 G Needle length: 12.7 cm Needle insertion depth: 8 cm Catheter type: closed end flexible Catheter size: 19 g Assessment Sensory level: T6 Additional Notes The epidural will be used for post op pain management.

## 2011-09-15 NOTE — Transfer of Care (Signed)
Immediate Anesthesia Transfer of Care Note  Patient: Christine Jordan  Procedure(s) Performed: Procedure(s) (LRB): MYOMECTOMY (N/A)  Patient Location: PACU  Anesthesia Type: Spinal and Epidural  Level of Consciousness: sedated  Airway & Oxygen Therapy: Patient Spontanous Breathing and Patient connected to nasal cannula oxygen  Post-op Assessment: Report given to PACU RN  Post vital signs: Reviewed and stable  Complications: No apparent anesthesia complications

## 2011-09-15 NOTE — OR Nursing (Signed)
0935 Seprafilm applied to outside of uterus by Dr. Renaldo Fiddler.

## 2011-09-15 NOTE — Addendum Note (Signed)
Addendum  created 09/15/11 1512 by Algis Greenhouse, CRNA   Modules edited:Notes Section

## 2011-09-15 NOTE — Anesthesia Postprocedure Evaluation (Signed)
  Anesthesia Post-op Note  Patient: Christine Jordan  Procedure(s) Performed: Procedure(s) (LRB): MYOMECTOMY (N/A)  Patient Location: Women's Unit  Anesthesia Type: Spinal and Epidural  Level of Consciousness: sedated  Airway and Oxygen Therapy: Patient Spontanous Breathing  Post-op Pain: none  Post-op Assessment: Post-op Vital signs reviewed  Post-op Vital Signs: Reviewed and stable  Complications: No apparent anesthesia complications

## 2011-09-16 ENCOUNTER — Encounter (HOSPITAL_COMMUNITY): Payer: Self-pay | Admitting: Obstetrics and Gynecology

## 2011-09-16 LAB — CBC
HCT: 24.1 % — ABNORMAL LOW (ref 36.0–46.0)
MCH: 23.2 pg — ABNORMAL LOW (ref 26.0–34.0)
MCHC: 30.3 g/dL (ref 30.0–36.0)
MCV: 76.8 fL — ABNORMAL LOW (ref 78.0–100.0)
Platelets: 149 10*3/uL — ABNORMAL LOW (ref 150–400)
WBC: 10.9 10*3/uL — ABNORMAL HIGH (ref 4.0–10.5)

## 2011-09-16 NOTE — Progress Notes (Signed)
UR chart review completed.  

## 2011-09-16 NOTE — Progress Notes (Signed)
1 Day Post-Op Procedure(s) (LRB): MYOMECTOMY (N/A)  Subjective: Patient reports nausea and incisional pain.  Epidural and foley cath removed this morning  Objective: I have reviewed patient's vital signs, intake and output, medications and labs.  Gen - NAD CV - RRR Lungs - CTAB Abd - decreased BS, softly distended.   Inc - c/do Ext - SCD in place bilat  Assessment: s/p Procedure(s) (LRB): MYOMECTOMY (N/A): stable  Plan: Advance diet Encourage ambulation Advance to PO medication Discontinue IV fluids  LOS: 1 day    Christine Jordan 09/16/2011, 7:06 AM

## 2011-09-16 NOTE — Progress Notes (Signed)
1 Day Post-Op Procedure(s) (LRB): MYOMECTOMY (N/A)  Subjective: Patient reports incisional pain, tolerating PO and no problems voiding.    Objective: I have reviewed patient's vital signs and intake and output.  General: alert and cooperative GI: soft, non-tender; bowel sounds normal; no masses,  no organomegaly Extremities: extremities normal, atraumatic, no cyanosis or edema  Assessment: s/p Procedure(s) (LRB): MYOMECTOMY (N/A): stable and progressing well  Plan: Advance diet Encourage ambulation Advance to PO medication  LOS: 1 day    Cathlyn Tersigni 09/16/2011, 1:18 PM

## 2011-09-16 NOTE — Progress Notes (Signed)
Attempted to get pt to walk three times this shift.  Pt has only taken a few steps from the bed.  Pt states her right leg still feels numb and heavy.  Pt able to wiggle toes, move, and bear weight on her right leg, but unable to bend it.  Will continue to monitor.

## 2011-09-17 MED ORDER — FERROUS SULFATE 325 (65 FE) MG PO TBEC: 325.0000 mg | DELAYED_RELEASE_TABLET | Freq: Two times a day (BID) | ORAL | Status: DC

## 2011-09-17 MED ORDER — IBUPROFEN 600 MG PO TABS: 600.0000 mg | ORAL_TABLET | Freq: Four times a day (QID) | ORAL | Status: AC | PRN

## 2011-09-17 MED ORDER — OXYCODONE-ACETAMINOPHEN 5-325 MG PO TABS: 1.0000 | ORAL_TABLET | ORAL | Status: AC | PRN

## 2011-09-17 NOTE — Discharge Summary (Signed)
NAME:  Christine Jordan, Christine Jordan NO.:  [id number]  MEDICAL RECORD NO.:  [id number]  LOCATION:  9302                          FACILITY:  [facility]  PHYSICIAN:  Zelphia Cairo, MD    DATE OF BIRTH:  Mar 10, 1976  DATE OF ADMISSION:  09/15/2011 DATE OF DISCHARGE:  09/17/2011                              DISCHARGE SUMMARY   ADMISSION DIAGNOSES: 1. Symptomatic fibroid uterus. 2. Anemia.  PROCEDURE:  Myomectomy.  HOSPITAL COURSE:  The patient was admitted to the hospital for surgical management of symptomatic fibroids causing anemia, requiring iron transfusion.  On the day of admission, she underwent abdominal myomectomy.  Please see operative note for further details of the surgery.  Additionally, she was given an IV PCA for pain control.  Foley catheter was in place.  On postoperative day #1, her vital signs were stable.  Hemoglobin was 7.3.  She was tolerating a regular diet and had good bowel sounds.  Her diet was advanced.  She was able to ambulate. Her Foley catheter was discontinued.  Pain was controlled with p.o. pain medications.  On postoperative day #2, her pain was well controlled. She was ambulating and urinating without difficulty and tolerating a regular diet.  She was starting to pass flatus.  Her abdomen was softly distended, nontender.  Incision was intact without erythema or drainage. She was felt to be stable for discharge.  She was discharged home with prescription for Percocet, ibuprofen, and iron.  She will follow up in the office in 1 week.     Zelphia Cairo, MD     GA/MEDQ  D:  09/17/2011  T:  09/17/2011  Job:  [id number]

## 2011-09-17 NOTE — Progress Notes (Signed)
2 Days Post-Op Procedure(s) (LRB): MYOMECTOMY (N/A)  Subjective: Patient reports incisional pain, tolerating PO, + flatus and no problems voiding.    Objective: I have reviewed patient's vital signs, intake and output, medications and labs.  General: alert and cooperative GI: soft, non-tender; bowel sounds normal; no masses,  no organomegaly Extremities: extremities normal, atraumatic, no cyanosis or edema  Assessment: s/p Procedure(s) (LRB): MYOMECTOMY (N/A): stable, progressing well and tolerating diet  Plan: Advance diet Encourage ambulation Discharge home  LOS: 2 days    Christine Jordan 09/17/2011, 8:44 AM

## 2011-09-17 NOTE — Discharge Instructions (Signed)
Myomectomy Care After  Refer to this sheet in the next few weeks. These instructions provide you with information on caring for yourself after your procedure. Your caregiver may also give you specific instructions. Your treatment has been planned according to current medical practices, but problems sometimes occur. Call your caregiver if you have any problems or questions after your procedure. HOME CARE INSTRUCTIONS   Only take over-the-counter or prescription medicines for pain, discomfort, or fever as directed by your caregiver. Avoid aspirin because it can cause bleeding.   Do not douche, use tampons, or have intercourse until given permission to by your caregiver.   Change your bandage (dressing) as directed.   Do not drive until you are given permission to by your caregiver.   Take showers instead of baths as directed by your caregiver.   If you become constipated, you may take a mild laxative with your caregiver's permission. Eat more bran and drink enough fluids to keep your urine clear or pale yellow.   Take your temperature twice a day and write it down.   Do not drink alcohol.   Do not drive while on pain medicine (narcotics).   Have help at home for 1 week, or until you can do your own household activities.   Keep all follow-up appointments with your caregiver.  SEEK MEDICAL CARE IF:  You develop a temperature of 100 F (37.8 C) or higher.   You have increasing stomach pain and medicine does not help.   You have nausea, vomiting, or diarrhea.   You have pain when you urinate, or you have blood in your urine.   You have a rash on your body.   You have pain or redness where your intravenous (IV) access tube was inserted.   You develop weakness or lightheadedness.   You need stronger pain medicine.   You have a reaction or side effects from your medicines.  SEEK IMMEDIATE MEDICAL CARE IF:   You have pain, swelling, or any kind of drainage from your incision.     You have pain, swelling, or redness in your leg.   You have chest pain.   You faint.   You have shortness of breath.   You have heavy vaginal bleeding.   You see pus coming from the incision.   Your incision is opening up.  MAKE SURE YOU:  Understand these instructions.   Will watch your condition.   Will get help right away if you are not doing well or get worse.  Document Released: 09/18/2010 Document Revised: 04/17/2011 Document Reviewed: 09/18/2010 Exit[employer] Patient Information 2012 [employer]. 

## 2011-09-17 NOTE — Progress Notes (Signed)
Pt. Is discharged in the care of Mother per ambulatory. Stable. Denies any excessive vaginal bleeding or pain. Incisional area is clean and dry. Horton Marshall is in place.

## 2011-09-30 NOTE — Op Note (Signed)
NAME:  Christine Jordan, Christine Jordan NO.:  [id number]  MEDICAL RECORD NO.:  [id number]  LOCATION:  9302                          FACILITY:  [facility]  PHYSICIAN:  Zelphia Cairo, MD    DATE OF BIRTH:  11-01-1975  DATE OF PROCEDURE: DATE OF DISCHARGE:  09/17/2011                              OPERATIVE REPORT   PREOPERATIVE DIAGNOSIS:  Symptomatic fibroids.  POSTOPERATIVE DIAGNOSIS:  Symptomatic fibroids.  SURGERY:  Abdominal myomectomy.  SURGEON:  Zelphia Cairo, MD  ASSISTANT:  Duke Salvia. Marcelle Overlie, MD  COMPLICATIONS:  None.  SPECIMEN:  Uterine fibroids.  CONDITION:  Stable to recovery room.  PROCEDURE:  After informed consent was obtained, the patient was taken to the operating room.  General anesthesia was obtained.  She was prepped and draped in sterile fashion, and a Foley catheter was inserted sterilely.  Pfannenstiel skin incision was made with a scalpel and extended sharply to the level of the fascia.  The fascia was incised in the midline and this was extended laterally using curved Mayo scissors. Peritoneum was then identified and entered sharply.  This was extended superiorly and inferiorly.  The uterus was approximately 18-20-week size, and noted to have multiple large fibroids.  The uterus was delivered through the abdominal incision.  Vasopressin was injected over the fibroid.  Incision was made over the fibroid and the fibroids were shelled out one by one.  The largest fibroid was located in the anterior uterus.  Once the fibroids were removed, double layer closure of the uterine myometrium was performed using interrupted sutures, and hemostasis was achieved.  Once all palpable fibroids were removed, the uterus was placed back into the pelvic cavity and the pelvis was irrigated with saline.  The uterus was reinspected and found to be hemostatic.  The peritoneum was then reapproximated with 0 Monocryl. The fascia was closed with 0 PDS and the skin closed with  Vicryl and Dermabond.  The patient was taken to the recovery room in stable condition.  Sponge, lap, needle, and instrument counts were correct x2.    Zelphia Cairo, MD    GA/MEDQ  D:  09/30/2011  T:  09/30/2011  Job:  [id number]

## 2011-10-08 ENCOUNTER — Telehealth: Payer: Self-pay | Admitting: Oncology

## 2011-10-08 NOTE — Telephone Encounter (Signed)
lmonvm adviisng the pt of her r/s July appts to the end of July for the lab and the md appt in aug due to the md is out of the office in early july

## 2011-11-10 ENCOUNTER — Other Ambulatory Visit: Payer: Managed Care, Other (non HMO)

## 2011-11-17 ENCOUNTER — Ambulatory Visit: Payer: Managed Care, Other (non HMO) | Admitting: Oncology

## 2011-12-08 ENCOUNTER — Other Ambulatory Visit (HOSPITAL_BASED_OUTPATIENT_CLINIC_OR_DEPARTMENT_OTHER): Payer: Managed Care, Other (non HMO) | Admitting: Lab

## 2011-12-08 DIAGNOSIS — D500 Iron deficiency anemia secondary to blood loss (chronic): Secondary | ICD-10-CM

## 2011-12-08 LAB — CBC WITH DIFFERENTIAL/PLATELET
BASO%: 0.6 % (ref 0.0–2.0)
Eosinophils Absolute: 0.2 10*3/uL (ref 0.0–0.5)
MCHC: 31.7 g/dL (ref 31.5–36.0)
MONO#: 0.5 10*3/uL (ref 0.1–0.9)
NEUT#: 3.6 10*3/uL (ref 1.5–6.5)
Platelets: 298 10*3/uL (ref 145–400)
RBC: 4.59 10*6/uL (ref 3.70–5.45)
RDW: 15.5 % — ABNORMAL HIGH (ref 11.2–14.5)
WBC: 6.4 10*3/uL (ref 3.9–10.3)
lymph#: 2.1 10*3/uL (ref 0.9–3.3)

## 2011-12-08 LAB — IRON AND TIBC
%SAT: 8 % — ABNORMAL LOW (ref 20–55)
Iron: 32 ug/dL — ABNORMAL LOW (ref 42–145)
TIBC: 395 ug/dL (ref 250–470)

## 2011-12-08 LAB — FERRITIN: Ferritin: 12 ng/mL (ref 10–291)

## 2011-12-09 ENCOUNTER — Other Ambulatory Visit: Payer: Self-pay | Admitting: Oncology

## 2011-12-09 DIAGNOSIS — D500 Iron deficiency anemia secondary to blood loss (chronic): Secondary | ICD-10-CM

## 2011-12-10 ENCOUNTER — Telehealth: Payer: Self-pay | Admitting: *Deleted

## 2011-12-10 NOTE — Telephone Encounter (Signed)
Per staff message and POF I have schedudled appt. JMW

## 2011-12-10 NOTE — Telephone Encounter (Signed)
[name] an email to set up patient iv iron on 12-22-2011

## 2011-12-15 ENCOUNTER — Ambulatory Visit (HOSPITAL_BASED_OUTPATIENT_CLINIC_OR_DEPARTMENT_OTHER): Payer: Managed Care, Other (non HMO)

## 2011-12-15 ENCOUNTER — Ambulatory Visit (HOSPITAL_BASED_OUTPATIENT_CLINIC_OR_DEPARTMENT_OTHER): Payer: Managed Care, Other (non HMO) | Admitting: Oncology

## 2011-12-15 ENCOUNTER — Telehealth: Payer: Self-pay | Admitting: Oncology

## 2011-12-15 ENCOUNTER — Encounter: Payer: Self-pay | Admitting: Oncology

## 2011-12-15 VITALS — BP 124/81 | HR 77 | Temp 98.5°F | Resp 20 | Ht 68.0 in | Wt 163.1 lb

## 2011-12-15 DIAGNOSIS — D500 Iron deficiency anemia secondary to blood loss (chronic): Secondary | ICD-10-CM

## 2011-12-15 MED ORDER — SODIUM CHLORIDE 0.9 % IV SOLN
Freq: Once | INTRAVENOUS | Status: AC
  Administered 2011-12-15: 16:00:00 via INTRAVENOUS

## 2011-12-15 MED ORDER — SODIUM CHLORIDE 0.9 % IV SOLN
1020.0000 mg | Freq: Once | INTRAVENOUS | Status: AC
  Administered 2011-12-15: 1020 mg via INTRAVENOUS
  Filled 2011-12-15: qty 34

## 2011-12-15 NOTE — Progress Notes (Signed)
OFFICE PROGRESS NOTE  CC  Christine Herter, MD [address] [place] [address] Dr. Zelphia Cairo  DIAGNOSIS: 36 year old female with iron deficiency anemia Due to chronic blood loss  PRIOR THERAPY:  #1 patient is status post IV iron infusion with Fara heme in early April 2013.  CURRENT THERAPY:Surveillance  INTERVAL HISTORY: Christine Jordan 36 y.o. female returns for Followup visit. Overall she is doing well. But she is beginning to feel weak tired and not having as much energy. She does have a ferritin of only 12 today. She denies having any bleeding problems. She has no nausea or vomiting. No myalgias or arthralgias. Remainder of the 10 point review of systems is negative.  MEDICAL HISTORY: Past Medical History  Diagnosis Date  . Anemia   . Headache   . Iron deficiency anemia due to chronic blood loss 08/07/2011    ALLERGIES:   has no known allergies.  MEDICATIONS:  Current Outpatient Prescriptions  Medication Sig Dispense Refill  . ferrous sulfate 325 (65 FE) MG EC tablet Take 1 tablet (325 mg total) by mouth 2 (two) times daily with a meal.  60 tablet  6  . Prenatal Vit-Fe Fumarate-FA (PRENATAL MULTIVITAMIN) TABS Take 1 tablet by mouth daily.        SURGICAL HISTORY:  Past Surgical History  Procedure Date  . No past surgeries   . Myomectomy 09/15/2011    Procedure: MYOMECTOMY;  Surgeon: Zelphia Cairo, MD;  Location: [facility];  Service: Gynecology;  Laterality: N/A;  abdominal    REVIEW OF SYSTEMS:  Pertinent items are noted in HPI.   PHYSICAL EXAMINATION: General appearance: alert, cooperative and appears stated age Neck: no adenopathy, no carotid bruit, no JVD, supple, symmetrical, trachea midline and thyroid not enlarged, symmetric, no tenderness/mass/nodules Lymph nodes: Cervical, supraclavicular, and axillary nodes normal. Resp: clear to auscultation bilaterally and normal percussion bilaterally Back: symmetric, no curvature. ROM normal. No  CVA tenderness. Cardio: regular rate and rhythm, S1, S2 normal, no murmur, click, rub or gallop GI: soft, non-tender; bowel sounds normal; no masses,  no organomegaly Extremities: extremities normal, atraumatic, no cyanosis or edema Neurologic: Grossly normal  ECOG PERFORMANCE STATUS: 0 - Asymptomatic  Blood pressure 124/81, pulse 77, temperature 98.5 F (36.9 C), resp. rate 20, height 5\' 8"  (1.727 m), weight 163 lb 1.6 oz (73.982 kg).  LABORATORY DATA: Lab Results  Component Value Date   WBC 6.4 12/08/2011   HGB 11.7 12/08/2011   HCT 36.9 12/08/2011   MCV 80.4 12/08/2011   PLT 298 12/08/2011      Chemistry      Component Value Date/Time   NA 138 08/07/2011 1453   K 3.9 08/07/2011 1453   CL 103 08/07/2011 1453   CO2 27 08/07/2011 1453   BUN 10 08/07/2011 1453   CREATININE 0.74 08/07/2011 1453      Component Value Date/Time   CALCIUM 9.4 08/07/2011 1453   ALKPHOS 35* 08/07/2011 1453   AST 16 08/07/2011 1453   ALT 8 08/07/2011 1453   BILITOT 0.2* 08/07/2011 1453       RADIOGRAPHIC STUDIES:  No results found.  ASSESSMENT: 36 year old female with iron deficiency anemia due to chronic blood loss.After receiving 1 dose of parenteral iron patient had significant improvement in her symptoms as well as H&H and ferritin level. She now has a ferritin of only 12. Thus requiring more therapy with feraheme.   PLAN: Since her ferritin is only 12 today compared to 244 on her last visit I  will proceed with giving her IV iron again today. Risks and benefits of this were discussed with her. She would like to proceed today. I will plan on seeing her back in 4 months time in followup. We will plan on getting iron studies at that time.  All questions were answered. The patient knows to call the clinic with any problems, questions or concerns. We can certainly see the patient much sooner if necessary.  I spent 20 minutes counseling the patient face to face. The total time spent in the appointment was 30  minutes.    Christine Second, MD Medical/Oncology [facility] [phone] (beeper) [phone] (Office)  12/15/2011, 2:42 PM

## 2011-12-15 NOTE — Patient Instructions (Addendum)
Proceed with IV iron today since your ferritin is low  I will see you back in 4 months

## 2011-12-15 NOTE — Patient Instructions (Addendum)
Ferumoxytol injection What is this medicine? FERUMOXYTOL is an iron complex. Iron is used to make healthy red blood cells, which carry oxygen and nutrients throughout the body. This medicine is used to treat iron deficiency anemia in people with chronic kidney disease. This medicine may be used for other purposes; ask your health care provider or pharmacist if you have questions. What should I tell my health care provider before I take this medicine? They need to know if you have any of these conditions: -anemia not caused by low iron levels -high levels of iron in the blood -magnetic resonance imaging (MRI) test scheduled -an unusual or allergic reaction to iron, other medicines, foods, dyes, or preservatives -pregnant or trying to get pregnant -breast-feeding How should I use this medicine? This medicine is for infusion into a vein. It is given by a health care professional in a hospital or clinic setting. Talk to your pediatrician regarding the use of this medicine in children. Special care may be needed. Overdosage: If you think you've taken too much of this medicine contact a poison control center or emergency room at once. Overdosage: If you think you have taken too much of this medicine contact a poison control center or emergency room at once. NOTE: This medicine is only for you. Do not share this medicine with others. What if I miss a dose? It is important not to miss your dose. Call your doctor or health care professional if you are unable to keep an appointment. What may interact with this medicine? This medicine may interact with the following medications: -other iron products This list may not describe all possible interactions. Give your health care provider a list of all the medicines, herbs, non-prescription drugs, or dietary supplements you use. Also tell them if you smoke, drink alcohol, or use illegal drugs. Some items may interact with your medicine. What should I watch  for while using this medicine? Visit your doctor or healthcare professional regularly. Tell your doctor or healthcare professional if your symptoms do not start to get better or if they get worse. You may need blood work done while you are taking this medicine. You may need to follow a special diet. Talk to your doctor. Foods that contain iron include: whole grains/cereals, dried fruits, beans, or peas, leafy green vegetables, and organ meats (liver, kidney). What side effects may I notice from receiving this medicine? Side effects that you should report to your doctor or health care professional as soon as possible: -allergic reactions like skin rash, itching or hives, swelling of the face, lips, or tongue -breathing problems -changes in blood pressure -feeling faint or lightheaded, falls -fever or chills -flushing, sweating, or hot feelings -swelling of the ankles or feet Side effects that usually do not require medical attention (Report these to your doctor or health care professional if they continue or are bothersome.): -diarrhea -headache -nausea, vomiting -stomach pain This list may not describe all possible side effects. Call your doctor for medical advice about side effects. You may report side effects to FDA at 1-800-FDA-1088. Where should I keep my medicine? This drug is given in a hospital or clinic and will not be stored at home. NOTE: This sheet is a summary. It may not cover all possible information. If you have questions about this medicine, talk to your doctor, pharmacist, or health care provider.  2012, [employer] Standard. (01/19/2008 9:48:25 PM) 

## 2011-12-15 NOTE — Telephone Encounter (Signed)
gve the pt her dec 2013 appt calendar °

## 2011-12-19 ENCOUNTER — Telehealth: Payer: Self-pay | Admitting: *Deleted

## 2011-12-19 NOTE — Telephone Encounter (Signed)
No she does not need feraheme on 8/12

## 2011-12-19 NOTE — Telephone Encounter (Signed)
Call from pharmacy. Pt scheduled for fereheme 8/12.  Pt recently received Fereheme 1020 mg on 8/5.  Will review with MD if additional Fereheme needed.

## 2011-12-22 ENCOUNTER — Ambulatory Visit: Payer: Managed Care, Other (non HMO)

## 2011-12-22 ENCOUNTER — Telehealth: Payer: Self-pay | Admitting: Oncology

## 2011-12-22 NOTE — Telephone Encounter (Signed)
lmonvm adviisng the pt that she does not need any iron tx today

## 2012-04-21 ENCOUNTER — Other Ambulatory Visit: Payer: Managed Care, Other (non HMO) | Admitting: Lab

## 2012-04-28 ENCOUNTER — Ambulatory Visit: Payer: Managed Care, Other (non HMO) | Admitting: Adult Health

## 2012-04-28 ENCOUNTER — Other Ambulatory Visit: Payer: Managed Care, Other (non HMO) | Admitting: Lab

## 2015-02-08 ENCOUNTER — Other Ambulatory Visit: Payer: Self-pay | Admitting: Obstetrics and Gynecology

## 2015-02-08 DIAGNOSIS — D259 Leiomyoma of uterus, unspecified: Secondary | ICD-10-CM

## 2015-02-09 ENCOUNTER — Ambulatory Visit
Admission: RE | Admit: 2015-02-09 | Discharge: 2015-02-09 | Disposition: A | Discharge status: Home / Self Care | Payer: Managed Care, Other (non HMO) | Source: Ambulatory Visit | Attending: Obstetrics and Gynecology | Admitting: Obstetrics and Gynecology

## 2015-02-09 DIAGNOSIS — D259 Leiomyoma of uterus, unspecified: Secondary | ICD-10-CM

## 2015-02-09 MED ORDER — GADOBENATE DIMEGLUMINE 529 MG/ML IV SOLN
15.0000 mL | Freq: Once | INTRAVENOUS | Status: AC | PRN
  Administered 2015-02-09: 15 mL via INTRAVENOUS

## 2015-02-16 ENCOUNTER — Encounter: Payer: Self-pay | Admitting: Gynecologic Oncology

## 2015-02-16 ENCOUNTER — Other Ambulatory Visit (HOSPITAL_BASED_OUTPATIENT_CLINIC_OR_DEPARTMENT_OTHER): Payer: Managed Care, Other (non HMO)

## 2015-02-16 ENCOUNTER — Ambulatory Visit: Payer: Managed Care, Other (non HMO) | Attending: Gynecologic Oncology | Admitting: Gynecologic Oncology

## 2015-02-16 VITALS — BP 138/94 | HR 83 | Temp 98.9°F | Resp 18 | Ht 68.0 in | Wt 178.2 lb

## 2015-02-16 DIAGNOSIS — D250 Submucous leiomyoma of uterus: Secondary | ICD-10-CM

## 2015-02-16 LAB — LACTATE DEHYDROGENASE (CC13): LDH: 211 U/L (ref 125–245)

## 2015-02-16 NOTE — Progress Notes (Signed)
Consult Note: Gyn-Onc  Consult was requested by Dr. Marylynn Pearson for the evaluation of Christine Jordan 39 y.o. female with a rapidly enlarging fibroid.  CC:  Chief Complaint  Patient presents with  . symptomatic fibroid uterus    Assessment/Plan:  Ms. Christine Jordan  is a 39 y.o.  year old G52 with what is most likely a rapidly growing benign submucosal leiomyoma.   I performed a history, physical examination, and personally reviewed the patient's imaging films including the MRI from 02/09/15. Her young age, prior history of benign myomas, and minimal symptoms in addition to imaging findings all favor a benign lesion. I am drawing an LDH level today. If normal or low, this is also supporting a benign fibroid. The patient strongly desires to preserve fertility if there is no malignancy. I discussed with the patient that I do not perform myomectomies, but I believe it is safe for Dr Julien Girt to perform the surgery provided that morcellation of the specimen does not occur. Frozen section of the myoma is not recommended because diagnosis of a sarcoma is difficult on frozen section, and therefore we should wait for final pathology to determine if there is malignancy. She understands that if malignancy was diagnosed on final pathology, I would recommend completion hysterectomy.  I will have her contact Dr Julien Girt office to determine being evaluated for myomectomy.   HPI: Ms Christine Jordan is a very pleasant 39 year old G0 who is seen in consultation at the request of Dr Marylynn Pearson for a rapidly enlarging fibroid in her anterior lower uterine segment. She was seen at the [facility] regional ER for abdominal discomfort and CT scan was performed which revealed a uterine mass and hydroureter. She was then seen by her OB/GYN's office where she was evaluated and the fibroid was appreciated. An MRI was performed on 02/09/2015 to better delineate this mass. It revealed a uterus measuring 14 x 12 x 18 cm.  It contained an intramural leiomyoma within the lower uterine segment with a submucosal component measuring 15 x 9 x 10 cm. There were additional smaller leiomyomas within the anterior wall measuring a proximally 2 cm. There was mild hydroureter and hydronephrosis of the right kidney secondary to mild obstruction from the enlarged uterus. No other intra-abdominal findings were appreciated.  The patient has a history of exposure to laparotomy and abdominal myomectomy in 2013 with Dr. Julien Girt for symptomatic fibroids.  She reports that symptoms from this fibroid developed in the past few months. They include a sensation of lower abdominal fullness. She denies a change in her menstrual cycle. Her periods are of normal volume and she denies menorrhagia.  She is a G0 and strongly desires future fertility.   Current Meds:  Outpatient Encounter Prescriptions as of 02/16/2015  Medication Sig  . HYDROcodone-acetaminophen (NORCO/VICODIN) 5-325 MG tablet Take 1 tablet by mouth every 6 (six) hours as needed for moderate pain.  Marland Kitchen ondansetron (ZOFRAN-ODT) 8 MG disintegrating tablet Take 8 mg by mouth every 8 (eight) hours as needed for nausea or vomiting.  . [DISCONTINUED] ferrous sulfate 325 (65 FE) MG EC tablet Take 1 tablet (325 mg total) by mouth 2 (two) times daily with a meal.  . [DISCONTINUED] Prenatal Vit-Fe Fumarate-FA (PRENATAL MULTIVITAMIN) TABS Take 1 tablet by mouth daily.   No facility-administered encounter medications on file as of 02/16/2015.    Allergy: No Known Allergies  Social Hx:   Social History   Social History  . Marital Status: Single  Spouse Name: N/A  . Number of Children: N/A  . Years of Education: N/A   Occupational History  . Not on file.   Social History Main Topics  . Smoking status: Never Smoker   . Smokeless tobacco: Never Used  . Alcohol Use: 0.6 oz/week    1 Glasses of wine per week  . Drug Use: No  . Sexual Activity: No   Other Topics Concern  . Not  on file   Social History Narrative    Past Surgical Hx:  Past Surgical History  Procedure Laterality Date  . No past surgeries    . Myomectomy  09/15/2011    Procedure: MYOMECTOMY;  Surgeon: Marylynn Pearson, MD;  Location: [facility];  Service: Gynecology;  Laterality: N/A;  abdominal    Past Medical Hx:  Past Medical History  Diagnosis Date  . Anemia   . Headache(784.0)   . Iron deficiency anemia due to chronic blood loss 08/07/2011    Past Gynecological History:   Patient's last menstrual period was 01/13/2015.  Family Hx:  Family History  Problem Relation Age of Onset  . Anemia Mother   . Diabetes Mother   . Hypertension Mother   . Cancer Father   . Hypertension Brother     Review of Systems:  Constitutional  Feels well,  See HPI  ENT Normal appearing ears and nares bilaterally Skin/Breast  No rash, sores, jaundice, itching, dryness Cardiovascular  No chest pain, shortness of breath, or edema  Pulmonary  No cough or wheeze.  Gastro Intestinal  No nausea, vomitting, or diarrhoea. No bright red blood per rectum, no abdominal pain, change in bowel movement, or constipation.  Genito Urinary  No frequency, urgency, dysuria,  Musculo Skeletal  No myalgia, arthralgia, joint swelling or pain  Neurologic  No weakness, numbness, change in gait,  Psychology  No depression, anxiety, insomnia.   Vitals:  Blood pressure 138/94, pulse 83, temperature 98.9 F (37.2 C), temperature source Oral, resp. rate 18, height 5' 8"  (1.727 m), weight 178 lb 3.2 oz (80.831 kg), last menstrual period 01/13/2015, SpO2 100 %.  Physical Exam: WD in NAD Neck  Supple NROM, without any enlargements.  Lymph Node Survey No cervical supraclavicular or inguinal adenopathy Cardiovascular  Pulse normal rate, regularity and rhythm. S1 and S2 normal.  Lungs  Clear to auscultation bilateraly, without wheezes/crackles/rhonchi. Good air movement.  Skin  No rash/lesions/breakdown  Psychiatry   Alert and oriented to person, place, and time  Abdomen  Normoactive bowel sounds, abdomen soft, non-tender and thin without evidence of hernia. Fullness in the lower abdomen filling pelvis and extending to 3cm below umbilicus. Back No CVA tenderness Genito Urinary  Vulva/vagina: Normal external female genitalia.  No lesions. No discharge or bleeding.  Bladder/urethra:  No lesions or masses, well supported bladder  Vagina: normal  Cervix: Normal appearing, no lesions.  Uterus:  Enlarged, mobile, filling pelvis  Adnexa: no discrete masses. Rectal  Good tone, no masses no cul de sac nodularity.  Extremities  No bilateral cyanosis, clubbing or edema.   Donaciano Eva, MD  02/16/2015, 2:22 PM

## 2015-02-16 NOTE — Patient Instructions (Signed)
Lab test today and follow up with Dr Julien Girt next week to make an appointment

## 2015-04-02 ENCOUNTER — Emergency Department (HOSPITAL_BASED_OUTPATIENT_CLINIC_OR_DEPARTMENT_OTHER): Payer: Managed Care, Other (non HMO)

## 2015-04-02 ENCOUNTER — Ambulatory Visit (HOSPITAL_BASED_OUTPATIENT_CLINIC_OR_DEPARTMENT_OTHER)
Admission: RE | Admit: 2015-04-02 | Discharge: 2015-04-02 | Disposition: A | Discharge status: Home / Self Care | Payer: Managed Care, Other (non HMO) | Source: Ambulatory Visit | Attending: Family Medicine | Admitting: Family Medicine

## 2015-04-02 ENCOUNTER — Ambulatory Visit (INDEPENDENT_AMBULATORY_CARE_PROVIDER_SITE_OTHER): Payer: Managed Care, Other (non HMO) | Admitting: Family Medicine

## 2015-04-02 ENCOUNTER — Inpatient Hospital Stay (HOSPITAL_BASED_OUTPATIENT_CLINIC_OR_DEPARTMENT_OTHER)
Admission: EM | Admit: 2015-04-02 | Discharge: 2015-04-07 | DRG: 301 | Disposition: A | Discharge status: Home / Self Care | Payer: Managed Care, Other (non HMO) | Attending: Internal Medicine | Admitting: Internal Medicine

## 2015-04-02 ENCOUNTER — Encounter (HOSPITAL_BASED_OUTPATIENT_CLINIC_OR_DEPARTMENT_OTHER): Payer: Self-pay | Admitting: Emergency Medicine

## 2015-04-02 VITALS — BP 140/82 | HR 112 | Temp 98.1°F | Resp 18 | Ht 65.0 in | Wt 185.8 lb

## 2015-04-02 DIAGNOSIS — Z79899 Other long term (current) drug therapy: Secondary | ICD-10-CM

## 2015-04-02 DIAGNOSIS — R600 Localized edema: Secondary | ICD-10-CM

## 2015-04-02 DIAGNOSIS — I82402 Acute embolism and thrombosis of unspecified deep veins of left lower extremity: Secondary | ICD-10-CM

## 2015-04-02 DIAGNOSIS — E876 Hypokalemia: Secondary | ICD-10-CM | POA: Diagnosis present

## 2015-04-02 DIAGNOSIS — Z8249 Family history of ischemic heart disease and other diseases of the circulatory system: Secondary | ICD-10-CM

## 2015-04-02 DIAGNOSIS — I82432 Acute embolism and thrombosis of left popliteal vein: Secondary | ICD-10-CM | POA: Insufficient documentation

## 2015-04-02 DIAGNOSIS — R0609 Other forms of dyspnea: Secondary | ICD-10-CM | POA: Diagnosis not present

## 2015-04-02 DIAGNOSIS — D250 Submucous leiomyoma of uterus: Secondary | ICD-10-CM | POA: Diagnosis present

## 2015-04-02 DIAGNOSIS — R0602 Shortness of breath: Secondary | ICD-10-CM

## 2015-04-02 DIAGNOSIS — I82442 Acute embolism and thrombosis of left tibial vein: Secondary | ICD-10-CM

## 2015-04-02 DIAGNOSIS — D259 Leiomyoma of uterus, unspecified: Secondary | ICD-10-CM | POA: Insufficient documentation

## 2015-04-02 DIAGNOSIS — I82449 Acute embolism and thrombosis of unspecified tibial vein: Secondary | ICD-10-CM | POA: Diagnosis present

## 2015-04-02 DIAGNOSIS — D500 Iron deficiency anemia secondary to blood loss (chronic): Secondary | ICD-10-CM | POA: Diagnosis present

## 2015-04-02 DIAGNOSIS — I82412 Acute embolism and thrombosis of left femoral vein: Principal | ICD-10-CM | POA: Diagnosis present

## 2015-04-02 DIAGNOSIS — M79652 Pain in left thigh: Secondary | ICD-10-CM | POA: Insufficient documentation

## 2015-04-02 LAB — BASIC METABOLIC PANEL
ANION GAP: 8 (ref 5–15)
BUN: 14 mg/dL (ref 6–20)
CALCIUM: 9.2 mg/dL (ref 8.9–10.3)
CHLORIDE: 104 mmol/L (ref 101–111)
CO2: 23 mmol/L (ref 22–32)
Creatinine, Ser: 0.95 mg/dL (ref 0.44–1.00)
GFR calc non Af Amer: >60 mL/min (ref >60)
Glucose, Bld: 108 mg/dL — ABNORMAL HIGH (ref 65–99)
POTASSIUM: 3.3 mmol/L — AB (ref 3.5–5.1)
Sodium: 135 mmol/L (ref 135–145)

## 2015-04-02 LAB — APTT: APTT: 26 s (ref 24–37)

## 2015-04-02 LAB — PROTIME-INR
INR: 1.1 (ref 0.00–1.49)
Prothrombin Time: 14.4 seconds (ref 11.6–15.2)

## 2015-04-02 MED ORDER — POTASSIUM CHLORIDE CRYS ER 20 MEQ PO TBCR: 20.0000 meq | EXTENDED_RELEASE_TABLET | Freq: Every day | ORAL | Status: DC

## 2015-04-02 MED ORDER — ENOXAPARIN SODIUM 100 MG/ML ~~LOC~~ SOLN
1.0000 mg/kg | SUBCUTANEOUS | Status: AC
  Administered 2015-04-02: 85 mg via SUBCUTANEOUS
  Filled 2015-04-02: qty 1

## 2015-04-02 MED ORDER — FUROSEMIDE 20 MG PO TABS: 20.0000 mg | ORAL_TABLET | Freq: Two times a day (BID) | ORAL | Status: AC

## 2015-04-02 NOTE — ED Notes (Signed)
Patient just had  An U/S to left leg and was found to have a DVT

## 2015-04-02 NOTE — ED Provider Notes (Signed)
CSN: [id number]     Arrival date & time 04/02/15  2130 History   First MD Initiated Contact with Patient 04/02/15 2155     Chief Complaint  Patient presents with  . DVT     (Consider location/radiation/quality/duration/timing/severity/associated sxs/prior Treatment) HPI]  Pt with large uterine fibroid, hx anemia p/w left lower extremity DVT that was found today on ultrasound ordered by PCP.  Pt has had tightness and swelling of the left leg x 5 days.  She has also had shortness of breath with any activity for the past 3 days.  Denies fevers, cough, chest pain.  Denies recent immobilization, exogenous estrogen.  Her father had hx DVT for unknown reason, died shortly after that for another reason.  Pt does have extremely large fibroid of the uterus that she plans to have surgery on in January.  She previously had surgery for the same reason approximately 6 months ago.    Past Medical History  Diagnosis Date  . Anemia   . Headache(784.0)   . Iron deficiency anemia due to chronic blood loss 08/07/2011   Past Surgical History  Procedure Laterality Date  . No past surgeries    . Myomectomy  09/15/2011    Procedure: MYOMECTOMY;  Surgeon: Marylynn Pearson, MD;  Location: [facility];  Service: Gynecology;  Laterality: N/A;  abdominal   Family History  Problem Relation Age of Onset  . Anemia Mother   . Diabetes Mother   . Hypertension Mother   . Cancer Father   . Hypertension Brother    Social History  Substance Use Topics  . Smoking status: Never Smoker   . Smokeless tobacco: Never Used  . Alcohol Use: 0.6 oz/week    1 Glasses of wine per week   OB History    No data available     Review of Systems  All other systems reviewed and are negative.     Allergies  Review of patient's allergies indicates no known allergies.  Home Medications   Prior to Admission medications   Medication Sig Start Date End Date Taking? Authorizing Provider  furosemide (LASIX) 20 MG tablet Take 1  tablet (20 mg total) by mouth 2 (two) times daily. 04/02/15   Shawnee Knapp, MD  HYDROcodone-acetaminophen (NORCO/VICODIN) 5-325 MG tablet Take 1 tablet by mouth every 6 (six) hours as needed for moderate pain. 01/25/15   Historical Provider, MD  ondansetron (ZOFRAN-ODT) 8 MG disintegrating tablet Take 8 mg by mouth every 8 (eight) hours as needed for nausea or vomiting. 01/25/15   Historical Provider, MD  potassium chloride SA (K-DUR,KLOR-CON) 20 MEQ tablet Take 1 tablet (20 mEq total) by mouth daily. 04/02/15   Shawnee Knapp, MD   BP 153/100 mmHg  Pulse 90  Temp(Src) 98.3 F (36.8 C) (Oral)  Resp 20  Ht 5' 4"  (1.626 m)  Wt 83.462 kg  BMI 31.57 kg/m2  SpO2 100%  LMP 03/07/2015 Physical Exam  Constitutional: She appears well-developed and well-nourished. No distress.  HENT:  Head: Normocephalic and atraumatic.  Neck: Neck supple.  Cardiovascular: Normal rate and regular rhythm.   Pulmonary/Chest: Effort normal and breath sounds normal. No respiratory distress. She has no wheezes. She has no rales.  Abdominal: Soft. She exhibits mass.  Musculoskeletal:  Left lower extremity with diffuse edema, erythema, mild tenderness.  Distal pulses intact.    Neurological: She is alert.  Skin: She is not diaphoretic.  Nursing note and vitals reviewed.   ED Course  Procedures (including critical care  time) Labs Review Labs Reviewed  BASIC METABOLIC PANEL - Abnormal; Notable for the following:    Potassium 3.3 (*)    Glucose, Bld 108 (*)    All other components within normal limits  CBC WITH DIFFERENTIAL/PLATELET - Abnormal; Notable for the following:    WBC 12.0 (*)    RBC 3.76 (*)    Hemoglobin 8.0 (*)    HCT 27.3 (*)    MCV 72.6 (*)    MCH 21.3 (*)    MCHC 29.3 (*)    RDW 18.2 (*)    Neutro Abs 8.7 (*)    All other components within normal limits  PROTIME-INR  APTT    Imaging Review No results found. I have personally reviewed and evaluated these images and lab results as part of my  medical decision-making.   EKG Interpretation None       ED ECG REPORT   Date: 04/03/2015  Rate: 88  Rhythm: normal sinus rhythm  QRS Axis: normal  Intervals: normal  ST/T Wave abnormalities: nonspecific T wave changes  Conduction Disutrbances:none  Narrative Interpretation:   Old EKG Reviewed: none available  I have personally reviewed the EKG tracing and agree with the computerized printout as noted.   10:52 PM Discussed workup and plan with Dr Colin Rhein.   MDM   Final diagnoses:  DVT (deep venous thrombosis), left  Dyspnea on exertion    Afebrile, nontoxic patient presents to ED with DVT in left leg (outpatient [place]).  Pt also reveals she has DOE that has developed over the past 3 days, limiting her activities.   She has no SOB at rest and no pain in her leg.  She is actually quite comfortable.  Discussed pt workup and plan with Dr Colin Rhein. Given left lower extremity DVT and presumed PE, pt given lovenox and admitted to hospital. Labs significant for leukocytosis, anemia (chronic, worsening, large fibroid), mild hypokalemia.  Coags normal. Admitted to [facility] at [facility] (patient's preference), accepted by Dr Fuller Plan.      Clayton Bibles, PA-C 04/03/15 7939  Debby Freiberg, MD 04/06/15 [id number]

## 2015-04-02 NOTE — Progress Notes (Signed)
Subjective:    Patient ID: Christine Jordan, female    DOB: October 18, 1975, 39 y.o.   MRN: [id number] This chart was scribed for Delman Cheadle, MD by Marti Sleigh, Medical Scribe. This patient was seen in Room 2 and the patient's care was started a 7:12 PM.  Chief Complaint  Patient presents with  . Leg Swelling    left leg, 3 days ago   HPI HPI Comments: Christine Jordan is a 39 y.o. female who presents to [facility] complaining of swelling in her left leg for the last 3-4 days. She states she has a large fibroid in her uterus and is having it removed in January, and she believes that it may be causing the swelling in her leg. Her bowels have been abnormal, most likely due to pressure from the fibroid. She has been taking ibuprofen for discomfort. She denies family hx of blood clots. Her gynecologist is Dr. Kathryne Eriks[facility] at [facility]. Her surgeon is Dr. Merril Abbe at [facility] on [address] in [place].   Past Medical History  Diagnosis Date  . Anemia   . Headache(784.0)   . Iron deficiency anemia due to chronic blood loss 08/07/2011   No Known Allergies  Current Outpatient Prescriptions on File Prior to Visit  Medication Sig Dispense Refill  . HYDROcodone-acetaminophen (NORCO/VICODIN) 5-325 MG tablet Take 1 tablet by mouth every 6 (six) hours as needed for moderate pain.    Marland Kitchen ondansetron (ZOFRAN-ODT) 8 MG disintegrating tablet Take 8 mg by mouth every 8 (eight) hours as needed for nausea or vomiting.     No current facility-administered medications on file prior to visit.    Review of Systems  Constitutional: Negative for fever and chills.  Cardiovascular: Positive for leg swelling. Negative for chest pain.  Neurological: Negative for weakness and numbness.       Objective:  BP 140/82 mmHg  Pulse 112  Temp(Src) 98.1 F (36.7 C) (Oral)  Resp 18  Ht 5' 5"  (1.651 m)  Wt 185 lb 12.8 oz (84.278 kg)  BMI 30.92 kg/m2  SpO2 98%  LMP 03/07/2015  Physical Exam    Constitutional: She is oriented to person, place, and time. She appears well-developed and well-nourished. No distress.  HENT:  Head: Normocephalic and atraumatic.  Eyes: Pupils are equal, round, and reactive to light.  Neck: Neck supple.  Cardiovascular: Normal rate.   2+ dorsalis pedis pulses.  Pulmonary/Chest: Effort normal. No respiratory distress.  Musculoskeletal: Normal range of motion.  2+ pitting edema, feels very tight up to the popliteal fossa and thigh.   Neurological: She is alert and oriented to person, place, and time. Coordination normal.  Skin: Skin is warm and dry. She is not diaphoretic.  Psychiatric: She has a normal mood and affect. Her behavior is normal.  Nursing note and vitals reviewed.     Assessment & Plan:   1. Edema of left lower extremity     Orders Placed This Encounter  Procedures  . US Venous Img Lower Unilateral Left    Standing Status: Future     Number of Occurrences:      Standing Expiration Date: 06/01/2016    Order Specific Question:  Reason for Exam (SYMPTOM  OR DIAGNOSIS REQUIRED)    Answer:  r/o dvt, severe left lower ext edema x 2-3d    Order Specific Question:  Preferred imaging location?    Answer:  [facility]    Meds ordered this encounter  Medications  .  furosemide (LASIX) 20 MG tablet    Sig: Take 1 tablet (20 mg total) by mouth 2 (two) times daily.    Dispense:  30 tablet    Refill:  0  . potassium chloride SA (K-DUR,KLOR-CON) 20 MEQ tablet    Sig: Take 1 tablet (20 mEq total) by mouth daily.    Dispense:  30 tablet    Refill:  0    I personally performed the services described in this documentation, which was scribed in my presence. The recorded information has been reviewed and considered, and addended by me as needed.  Delman Cheadle, MD MPH   By signing my name below, I, Judithe Modest, attest that this documentation has been prepared under the direction and in the presence of Delman Cheadle, MD. Electronically  Signed: Judithe Modest, ER Scribe. 04/02/2015. 7:13 PM.  Pt left without any release to get her [place] so was not able to reivew any check out instructions on lasix/KJ, elevate, stat f/u with gyn and consider repeating pelvic CT if worseing.  Did show some prev right hydronephrosis

## 2015-04-02 NOTE — Patient Instructions (Signed)
Edema °Edema is an abnormal buildup of fluids in your body tissues. Edema is somewhat dependent on gravity to pull the fluid to the lowest place in your body. That makes the condition more common in the legs and thighs (lower extremities). Painless swelling of the feet and ankles is common and becomes more likely as you get older. It is also common in looser tissues, like around your eyes.  °When the affected area is squeezed, the fluid may move out of that spot and leave a dent for a few moments. This dent is called pitting.  °CAUSES  °There are many possible causes of edema. Eating too much salt and being on your feet or sitting for a long time can cause edema in your legs and ankles. Hot weather may make edema worse. Common medical causes of edema include: °· Heart failure. °· Liver disease. °· Kidney disease. °· Weak blood vessels in your legs. °· Cancer. °· An injury. °· Pregnancy. °· Some medications. °· Obesity.  °SYMPTOMS  °Edema is usually painless. Your skin may look swollen or shiny.  °DIAGNOSIS  °Your health care provider may be able to diagnose edema by asking about your medical history and doing a physical exam. You may need to have tests such as X-rays, an electrocardiogram, or blood tests to check for medical conditions that may cause edema.  °TREATMENT  °Edema treatment depends on the cause. If you have heart, liver, or kidney disease, you need the treatment appropriate for these conditions. General treatment may include: °· Elevation of the affected body part above the level of your heart. °· Compression of the affected body part. Pressure from elastic bandages or support stockings squeezes the tissues and forces fluid back into the blood vessels. This keeps fluid from entering the tissues. °· Restriction of fluid and salt intake. °· Use of a water pill (diuretic). These medications are appropriate only for some types of edema. They pull fluid out of your body and make you urinate more often. This  gets rid of fluid and reduces swelling, but diuretics can have side effects. Only use diuretics as directed by your health care provider. °HOME CARE INSTRUCTIONS  °· Keep the affected body part above the level of your heart when you are lying down.   °· Do not sit still or stand for prolonged periods.   °· Do not put anything directly under your knees when lying down. °· Do not wear constricting clothing or garters on your upper legs.   °· Exercise your legs to work the fluid back into your blood vessels. This may help the swelling go down.   °· Wear elastic bandages or support stockings to reduce ankle swelling as directed by your health care provider.   °· Eat a low-salt diet to reduce fluid if your health care provider recommends it.   °· Only take medicines as directed by your health care provider.  °SEEK MEDICAL CARE IF:  °· Your edema is not responding to treatment. °· You have heart, liver, or kidney disease and notice symptoms of edema. °· You have edema in your legs that does not improve after elevating them.   °· You have sudden and unexplained weight gain. °SEEK IMMEDIATE MEDICAL CARE IF:  °· You develop shortness of breath or chest pain.   °· You cannot breathe when you lie down. °· You develop pain, redness, or warmth in the swollen areas.   °· You have heart, liver, or kidney disease and suddenly get edema. °· You have a fever and your symptoms suddenly get worse. °MAKE SURE YOU:  °·   Understand these instructions. °· Will watch your condition. °· Will get help right away if you are not doing well or get worse. °  °This information is not intended to replace advice given to you by your health care provider. Make sure you discuss any questions you have with your health care provider. °  °Document Released: 04/28/2005 Document Revised: 05/19/2014 Document Reviewed: 02/18/2013 °Elsevier Interactive Patient Education ©2016 [employer] ° °

## 2015-04-03 ENCOUNTER — Encounter (HOSPITAL_COMMUNITY): Payer: Self-pay | Admitting: Radiology

## 2015-04-03 ENCOUNTER — Inpatient Hospital Stay (HOSPITAL_COMMUNITY): Payer: Managed Care, Other (non HMO)

## 2015-04-03 DIAGNOSIS — I82402 Acute embolism and thrombosis of unspecified deep veins of left lower extremity: Secondary | ICD-10-CM | POA: Diagnosis present

## 2015-04-03 DIAGNOSIS — R0609 Other forms of dyspnea: Secondary | ICD-10-CM

## 2015-04-03 DIAGNOSIS — I82449 Acute embolism and thrombosis of unspecified tibial vein: Secondary | ICD-10-CM | POA: Diagnosis present

## 2015-04-03 DIAGNOSIS — I82412 Acute embolism and thrombosis of left femoral vein: Secondary | ICD-10-CM | POA: Diagnosis present

## 2015-04-03 DIAGNOSIS — Z79899 Other long term (current) drug therapy: Secondary | ICD-10-CM | POA: Diagnosis not present

## 2015-04-03 DIAGNOSIS — D259 Leiomyoma of uterus, unspecified: Secondary | ICD-10-CM | POA: Diagnosis present

## 2015-04-03 DIAGNOSIS — D250 Submucous leiomyoma of uterus: Secondary | ICD-10-CM | POA: Diagnosis not present

## 2015-04-03 DIAGNOSIS — R0602 Shortness of breath: Secondary | ICD-10-CM | POA: Diagnosis not present

## 2015-04-03 DIAGNOSIS — D500 Iron deficiency anemia secondary to blood loss (chronic): Secondary | ICD-10-CM

## 2015-04-03 DIAGNOSIS — Z8249 Family history of ischemic heart disease and other diseases of the circulatory system: Secondary | ICD-10-CM | POA: Diagnosis not present

## 2015-04-03 DIAGNOSIS — E876 Hypokalemia: Secondary | ICD-10-CM

## 2015-04-03 LAB — CBC WITH DIFFERENTIAL/PLATELET
BASOS PCT: 0 %
Basophils Absolute: 0.1 10*3/uL (ref 0.0–0.1)
EOS ABS: 0.5 10*3/uL (ref 0.0–0.7)
EOS PCT: 4 %
HEMATOCRIT: 27.3 % — AB (ref 36.0–46.0)
Hemoglobin: 8 g/dL — ABNORMAL LOW (ref 12.0–15.0)
Lymphocytes Relative: 16 %
Lymphs Abs: 1.9 10*3/uL (ref 0.7–4.0)
MCH: 21.3 pg — AB (ref 26.0–34.0)
MCHC: 29.3 g/dL — AB (ref 30.0–36.0)
MCV: 72.6 fL — ABNORMAL LOW (ref 78.0–100.0)
MONOS PCT: 7 %
Monocytes Absolute: 0.9 10*3/uL (ref 0.1–1.0)
NEUTROS PCT: 73 %
Neutro Abs: 8.7 10*3/uL — ABNORMAL HIGH (ref 1.7–7.7)
Platelets: 241 10*3/uL (ref 150–400)
RBC: 3.76 MIL/uL — AB (ref 3.87–5.11)
RDW: 18.2 % — AB (ref 11.5–15.5)
WBC: 12 10*3/uL — AB (ref 4.0–10.5)

## 2015-04-03 LAB — BASIC METABOLIC PANEL
Anion gap: 7 (ref 5–15)
BUN: 14 mg/dL (ref 6–20)
CHLORIDE: 105 mmol/L (ref 101–111)
CO2: 25 mmol/L (ref 22–32)
Calcium: 9.2 mg/dL (ref 8.9–10.3)
Creatinine, Ser: 0.95 mg/dL (ref 0.44–1.00)
GFR calc Af Amer: >60 mL/min (ref >60)
GFR calc non Af Amer: >60 mL/min (ref >60)
GLUCOSE: 136 mg/dL — AB (ref 65–99)
POTASSIUM: 3.7 mmol/L (ref 3.5–5.1)
Sodium: 137 mmol/L (ref 135–145)

## 2015-04-03 LAB — CBC
HEMATOCRIT: 26.9 % — AB (ref 36.0–46.0)
Hemoglobin: 8.2 g/dL — ABNORMAL LOW (ref 12.0–15.0)
MCH: 22 pg — AB (ref 26.0–34.0)
MCHC: 30.5 g/dL (ref 30.0–36.0)
MCV: 72.3 fL — AB (ref 78.0–100.0)
Platelets: 211 10*3/uL (ref 150–400)
RBC: 3.72 MIL/uL — ABNORMAL LOW (ref 3.87–5.11)
RDW: 17.8 % — AB (ref 11.5–15.5)
WBC: 10.1 10*3/uL (ref 4.0–10.5)

## 2015-04-03 MED ORDER — ENOXAPARIN SODIUM 40 MG/0.4ML ~~LOC~~ SOLN
40.0000 mg | SUBCUTANEOUS | Status: DC
  Filled 2015-04-03: qty 0.4

## 2015-04-03 MED ORDER — POTASSIUM CHLORIDE CRYS ER 20 MEQ PO TBCR
40.0000 meq | EXTENDED_RELEASE_TABLET | Freq: Once | ORAL | Status: AC
  Administered 2015-04-03: 40 meq via ORAL
  Filled 2015-04-03: qty 2

## 2015-04-03 MED ORDER — ENOXAPARIN SODIUM 80 MG/0.8ML ~~LOC~~ SOLN
80.0000 mg | Freq: Two times a day (BID) | SUBCUTANEOUS | Status: DC
  Administered 2015-04-03 – 2015-04-07 (×9): 80 mg via SUBCUTANEOUS
  Filled 2015-04-03 (×9): qty 0.8

## 2015-04-03 MED ORDER — ACETAMINOPHEN 650 MG RE SUPP: 650.0000 mg | Freq: Four times a day (QID) | RECTAL | Status: DC | PRN

## 2015-04-03 MED ORDER — IBUPROFEN 200 MG PO TABS
400.0000 mg | ORAL_TABLET | Freq: Four times a day (QID) | ORAL | Status: DC | PRN
  Administered 2015-04-03 – 2015-04-05 (×5): 400 mg via ORAL
  Filled 2015-04-03 (×5): qty 2

## 2015-04-03 MED ORDER — ENOXAPARIN (LOVENOX) PATIENT EDUCATION KIT
PACK | Freq: Once | Status: AC
  Administered 2015-04-03: 22:00:00
  Filled 2015-04-03: qty 1

## 2015-04-03 MED ORDER — POTASSIUM CHLORIDE CRYS ER 20 MEQ PO TBCR: 20.0000 meq | EXTENDED_RELEASE_TABLET | Freq: Every day | ORAL | Status: DC

## 2015-04-03 MED ORDER — ACETAMINOPHEN 325 MG PO TABS: 650.0000 mg | ORAL_TABLET | Freq: Four times a day (QID) | ORAL | Status: DC | PRN

## 2015-04-03 MED ORDER — ONDANSETRON HCL 4 MG/2ML IJ SOLN: 4.0000 mg | Freq: Four times a day (QID) | INTRAMUSCULAR | Status: DC | PRN

## 2015-04-03 MED ORDER — IOHEXOL 350 MG/ML SOLN
100.0000 mL | Freq: Once | INTRAVENOUS | Status: AC | PRN
  Administered 2015-04-03: 67 mL via INTRAVENOUS

## 2015-04-03 MED ORDER — SODIUM CHLORIDE 0.9 % IJ SOLN
3.0000 mL | Freq: Two times a day (BID) | INTRAMUSCULAR | Status: DC
  Administered 2015-04-03 – 2015-04-07 (×9): 3 mL via INTRAVENOUS

## 2015-04-03 MED ORDER — HYDROCODONE-ACETAMINOPHEN 5-325 MG PO TABS
1.0000 | ORAL_TABLET | Freq: Four times a day (QID) | ORAL | Status: DC | PRN
  Administered 2015-04-03 – 2015-04-06 (×5): 1 via ORAL
  Filled 2015-04-03 (×5): qty 1

## 2015-04-03 NOTE — Progress Notes (Signed)
I have seen and assessed patient and agree with Dr. Jonnie Kubly Caul assessment and plan. Patient presented with right lower extremity swelling and pain. Doppler ultrasound done consistent with an extensive left-sided lower extremity DVT involving common femoral vein, femoral vein, popliteal vein extending to the tibial veins with likely proximal extension into the pelvis. DVT likely related to compression from patient's enlarged fibroid uterus. Case was discussed with interventional radiology, Dr Earleen Newport to determine whether patient was a candidate for lysis. Patient is scheduled for surgery for her fibroids in 06/12/2015 by Dr. Posey Pronto at [facility]. Will check a CT angiogram of chest to rule out PE as patient did have complaints of shortness of breath on exertion. Continue full dose Lovenox. We'll start patient on Coumadin tomorrow with goal INR of 2-3. Patient will need to follow-up with interventional radiology as outpatient for further discussions concerning lysis of DVT which will need to be discussed and coordinated with her surgeon that [facility].

## 2015-04-03 NOTE — Care Management Note (Signed)
Case Management Note  Patient Details  Name: Christine Jordan MRN: [id number] Date of Birth: [birth year]  Subjective/Objective:    39 y/o f admitted w/L leg DVT. From home.                Action/Plan:d/c plan home.   Expected Discharge Date:                  Expected Discharge Plan:  Home/Self Care  In-House Referral:     Discharge planning Services  CM Consult  Post Acute Care Choice:    Choice offered to:     DME Arranged:    DME Agency:     HH Arranged:    HH Agency:     Status of Service:  In process, will continue to follow  Medicare Important Message Given:    Date Medicare IM Given:    Medicare IM give by:    Date Additional Medicare IM Given:    Additional Medicare Important Message give by:     If discussed at [place] of Stay Meetings, dates discussed:    Additional Comments:  Dessa Phi, RN 04/03/2015, 10:50 AM

## 2015-04-03 NOTE — Progress Notes (Signed)
ANTICOAGULATION CONSULT NOTE - Initial Consult  Pharmacy Consult for Warfarin Indication: DVT  No Known Allergies  Patient Measurements: Height: 5' 4"  (162.6 cm) Weight: 181 lb 14.4 oz (82.509 kg) IBW/kg (Calculated) : 54.7  Vital Signs: Temp: 98.5 F (36.9 C) (11/22 1434) Temp Source: Oral (11/22 1434) BP: 139/97 mmHg (11/22 1434) Pulse Rate: 107 (11/22 1434)  Labs:  Recent Labs  04/02/15 2325 04/03/15 0446  HGB 8.0* 8.2*  HCT 27.3* 26.9*  PLT 241 211  APTT 26  --   LABPROT 14.4  --   INR 1.10  --   CREATININE 0.95 0.95    Estimated Creatinine Clearance: 82.6 mL/min (by C-G formula based on Cr of 0.95).   Medical History: Past Medical History  Diagnosis Date  . Anemia   . Headache(784.0)   . Iron deficiency anemia due to chronic blood loss 08/07/2011    Assessment: 74 yoF with history of symptomatic uterine fibroids and prior myomectomy in 2013 presented with acute extensive left lower extremity DVT, etiology related to compression from the patient's enlarged fibroid uterus.  She is tentatively scheduled for additional myomectomy on 06/12/2015.  CT angio of chest was negative for PE.  Pharmacy currently dosing treatment Lovenox.  IR consulted for consideration of thrombolytic therapy.  IR recommended continuing with Lovenox for now until further discussions held regarding upcoming myomectomy.  Dr. Grandville Silos contacted pharmacy with instructions to start warfarin likely tomorrow.  Goal of Therapy:  INR 2-3   Plan:  Continue Lovenox 80 mg (1 mg/kg) SQ q12h. F/u with MD in AM if still appropriate to initiate warfarin 11/23. Daily INR ordered by MD.  Hershal Coria 04/03/2015,5:39 PM

## 2015-04-03 NOTE — H&P (Addendum)
[facility] History and Physical  KALIANNA VERBEKE ZOX:[id number] DOB: 25-Dec-1975 DOA: 04/02/2015  Referring physician: [facility] PCP: Marylynn Pearson, MD   Chief Complaint: Left leg pain and swelling  HPI:  Patient is a 39 year old female with past medical history of uterine fibroids and iron deficiency anemia; who presented to the emergency department at [facility] with complaints of left lower extremity swelling and pain over the 4-5 days. Denies any recent travel. Symptoms progressively worsened to the point which walking even a few steps it caused her significant pain. Also reported associated symptoms of shortness of breath on exertion. She gone to her primary care office yesterday and was given Lasix, potassium chloride, and referred for ultrasound of the lower extremities which revealed a DVT. Thereafter patient was sent to the emergency department and subsequently transferred to [facility] for further management. 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 She notes periods and time of 3-4 hours in which she is sitting at a desk. Denies any previous history of blood clotting issues to her knowledge. Family history is positive for her father passed away of DVT of unknown reason back in 2013. Patient also questions if her fibroid which is very large but could be the cause for her current symptoms. She also questions about whether her upcoming surgery will be affected   Review of Systems  Constitutional: Negative for fever and chills.  HENT: Negative for hearing loss and tinnitus.   Respiratory: Positive for shortness of breath.   Cardiovascular: Positive for leg swelling. Negative for chest pain.  Gastrointestinal: Positive for nausea. Negative for vomiting.  Genitourinary: Negative for urgency and frequency.  Musculoskeletal: Negative for falls and neck pain.  Skin: Negative for itching and rash.  Neurological:  Negative for tremors and sensory change.  Endo/Heme/Allergies: Negative for environmental allergies. Does not bruise/bleed easily.  Psychiatric/Behavioral: Negative for suicidal ideas and substance abuse.     Past Medical History  Diagnosis Date  . Anemia   . Headache(784.0)   . Iron deficiency anemia due to chronic blood loss 08/07/2011     Past Surgical History  Procedure Laterality Date  . No past surgeries    . Myomectomy  09/15/2011    Procedure: MYOMECTOMY;  Surgeon: Marylynn Pearson, MD;  Location: [facility];  Service: Gynecology;  Laterality: N/A;  abdominal      Social History:  reports that she has never smoked. She has never used smokeless tobacco. She reports that she drinks about 0.6 oz of alcohol per week. She reports that she does not use illicit drugs. where does patient live--home  Can patient participate in ADLs? Yes  No Known Allergies  Family History  Problem Relation Age of Onset  . Anemia Mother   . Diabetes Mother   . Hypertension Mother   . Cancer Father   . Hypertension Brother        Prior to Admission medications   Medication Sig Start Date End Date Taking? Authorizing Provider  HYDROcodone-acetaminophen (NORCO/VICODIN) 5-325 MG tablet Take 1 tablet by mouth every 6 (six) hours as needed for moderate pain. 01/25/15  Yes Historical Provider, MD  ibuprofen (ADVIL,MOTRIN) 200 MG tablet Take 400 mg by mouth every 6 (six) hours as needed for moderate pain.   Yes Historical Provider, MD  furosemide (LASIX) 20 MG tablet Take 1 tablet (20 mg total) by mouth 2 (two) times daily. 04/02/15   Laurey Arrow  Brigitte Pulse, MD  ondansetron (ZOFRAN-ODT) 8 MG disintegrating tablet Take 8 mg by mouth every 8 (eight) hours as needed for nausea or vomiting. 01/25/15   Historical Provider, MD  potassium chloride SA (K-DUR,KLOR-CON) 20 MEQ tablet Take 1 tablet (20 mEq total) by mouth daily. 04/02/15   Shawnee Knapp, MD     Physical Exam: Filed Vitals:   04/02/15 2138 04/03/15 0007  04/03/15 0245  BP: 153/100 143/92 140/91  Pulse: 90 86 97  Temp: 98.3 F (36.8 C) 98.8 F (37.1 C) 98.1 F (36.7 C)  TempSrc: Oral Oral Oral  Resp: 20 18 18   Height: 5' 4"  (1.626 m)  5' 4"  (1.626 m)  Weight: 83.462 kg (184 lb)  82.509 kg (181 lb 14.4 oz)  SpO2: 100% 98% 98%    Constitutional: Vital signs reviewed. Patient is a well-developed and well-nourished in no acute distress and cooperative with exam. Alert and oriented x3.  Head: Normocephalic and atraumatic  Ear: TM normal bilaterally  Mouth: no erythema or exudates, MMM  Eyes: PERRL, EOMI, conjunctivae normal, No scleral icterus.  Neck: Supple, Trachea midline normal ROM, No JVD, mass, thyromegaly, or carotid bruit present.  Cardiovascular: RRR, S1 normal, S2 normal, no MRG, pulses symmetric and intact bilaterally  Pulmonary/Chest: CTAB, no wheezes, rales, or rhonchi  Abdominal: Soft. Non-tender, non-distended, bowel sounds are normal, no masses, organomegaly, or guarding present.  GU: no CVA tenderness Musculoskeletal: No joint deformities, erythema, or stiffness, ROM full and no nontender Ext: Left lower extremity with 2+ edema and erythema. Right lower extremity within normal limits with no significant edema. No cyanosis, pulses palpable bilaterally (DP and PT)  Hematology: no cervical, inginal, or axillary adenopathy.  Neurological: A&O x3, Strenght is normal and symmetric bilaterally, cranial nerve II-XII are grossly intact, no focal motor deficit, sensory intact to light touch bilaterally.  Skin: Warm, dry and intact. No rash, cyanosis, or clubbing.  Psychiatric: Normal mood and affect. speech and behavior is normal. Judgment and thought content normal. Cognition and memory are normal.      Data Review   Micro Results No results found for this or any previous visit (from the past 240 hour(s)).  Radiology Reports No results found.   CBC  Recent Labs Lab 04/02/15 2325  WBC 12.0*  HGB 8.0*  HCT 27.3*  PLT  241  MCV 72.6*  MCH 21.3*  MCHC 29.3*  RDW 18.2*  LYMPHSABS 1.9  MONOABS 0.9  EOSABS 0.5  BASOSABS 0.1    Chemistries   Recent Labs Lab 04/02/15 2325  NA 135  K 3.3*  CL 104  CO2 23  GLUCOSE 108*  BUN 14  CREATININE 0.95  CALCIUM 9.2   ------------------------------------------------------------------------------------------------------------------ estimated creatinine clearance is 82.6 mL/min (by C-G formula based on Cr of 0.95). ------------------------------------------------------------------------------------------------------------------ No results for input(s): HGBA1C in the last 72 hours. ------------------------------------------------------------------------------------------------------------------ No results for input(s): CHOL, HDL, LDLCALC, TRIG, CHOLHDL, LDLDIRECT in the last 72 hours. ------------------------------------------------------------------------------------------------------------------ No results for input(s): TSH, T4TOTAL, T3FREE, THYROIDAB in the last 72 hours.  Invalid input(s): FREET3 ------------------------------------------------------------------------------------------------------------------ No results for input(s): VITAMINB12, FOLATE, FERRITIN, TIBC, IRON, RETICCTPCT in the last 72 hours.  Coagulation profile  Recent Labs Lab 04/02/15 2325  INR 1.10    No results for input(s): DDIMER in the last 72 hours.  Cardiac Enzymes No results for input(s): CKMB, TROPONINI, MYOGLOBIN in the last 168 hours.  Invalid input(s): CK ------------------------------------------------------------------------------------------------------------------ Invalid input(s): POCBNP   CBG: No results for input(s): GLUCAP in the last 168 hours.  EKG: Independently reviewed. Sinus rhythm   Assessment/Plan Principal Problem: DVT (deep venous thrombosis), left: Acute. Patient with symptoms and a ultrasound which was positive for a left DVT.  Patient reports some signs of shortness of breath on exertion. There is underlying possibility of a PE as well.  Addendum:Discussions with interventional radiology at 8am revealed extensive clot that may be amenable lysis. Also suspect clot could be related to uterine fibroid -Admit patient to telemetry -Continuous pulse oximetry -Lovenox per pharmacy for DVT treatment -Discuss anticoagulation options and a.m. -recommend formally consult ing interventional radiology for help with management of patient    Iron deficiency anemia due to chronic blood loss: Chronic. Patient with a hemoglobin of 8 and hematocrit of 27.3 patient's symptoms of shortness of breath on exertion could possibly be related to her anemia in part versus DVT/PE -   Monitor CBC  Fibroids, submucosal: Stable. Patient reports a upcoming surgery for removal of the fibroid in January.  Nausea: Acute on chronic. -prn zofran  Hypokalemia: Acute. Potassium noted be 3.3 -Potassium chloride 40 mEq 1 dose now -bmp in am  Code Status:   full Family Communication: bedside Disposition Plan: admit   Total time spent 55 minutes.Greater than 50% of this time was spent in counseling, explanation of diagnosis, planning of further management, and coordination of care  [facility] Pager [phone]  If 7PM-7AM, please contact night-coverage [email or web address] Password [facility] 04/03/2015, 3:42 AM

## 2015-04-03 NOTE — Consult Note (Signed)
Chief Complaint: Patient was seen in consultation today for consideration of thrombolytic therapy to left lower extremity DVT Chief Complaint  Patient presents with  . DVT    Referring Physician(s): Smith,R  History of Present Illness: Christine Jordan is a 39 y.o. female with history of symptomatic uterine fibroids and prior myomectomy in 2013 as well as associated iron deficiency anemia. Patient presented to the ED today with 4-5 day history of left lower extremity swelling and "heaviness" as well as increased erythema and tenderness to left medial thigh region. Subsequent lower extremity venous Doppler revealed extensive left-sided acute DVT involving the common femoral vein, femoral vein, popliteal vein and extending into the tibial veins. There was likely proximal extension into the pelvis with most likely etiology related to compression from the patient's enlarged fibroid uterus. CT angio of chest was negative for PE. Patient has no prior personal history of DVT but does have a positive family history of DVT in her father. She is a nonsmoker. She is tentatively scheduled for additional myomectomy on 06/12/2015 at [facility] by Ian Malkin. Her local  gynecologist is Dr. Marylynn Pearson.  Request now received for consideration of thrombolytic therapy of the extensive left lower extremity DVT.  Past Medical History  Diagnosis Date  . Anemia   . Headache(784.0)   . Iron deficiency anemia due to chronic blood loss 08/07/2011    Past Surgical History  Procedure Laterality Date  . No past surgeries    . Myomectomy  09/15/2011    Procedure: MYOMECTOMY;  Surgeon: Marylynn Pearson, MD;  Location: [facility];  Service: Gynecology;  Laterality: N/A;  abdominal    Allergies: Review of patient's allergies indicates no known allergies.  Medications: Prior to Admission medications   Medication Sig Start Date End Date Taking? Authorizing Provider    HYDROcodone-acetaminophen (NORCO/VICODIN) 5-325 MG tablet Take 1 tablet by mouth every 6 (six) hours as needed for moderate pain. 01/25/15  Yes Historical Provider, MD  ibuprofen (ADVIL,MOTRIN) 200 MG tablet Take 400 mg by mouth every 6 (six) hours as needed for moderate pain.   Yes Historical Provider, MD  furosemide (LASIX) 20 MG tablet Take 1 tablet (20 mg total) by mouth 2 (two) times daily. 04/02/15   Shawnee Knapp, MD  ondansetron (ZOFRAN-ODT) 8 MG disintegrating tablet Take 8 mg by mouth every 8 (eight) hours as needed for nausea or vomiting. 01/25/15   Historical Provider, MD  potassium chloride SA (K-DUR,KLOR-CON) 20 MEQ tablet Take 1 tablet (20 mEq total) by mouth daily. 04/02/15   Shawnee Knapp, MD     Family History  Problem Relation Age of Onset  . Anemia Mother   . Diabetes Mother   . Hypertension Mother   . Cancer Father   . Hypertension Brother     Social History   Social History  . Marital Status: Single    Spouse Name: N/A  . Number of Children: N/A  . Years of Education: N/A   Social History Main Topics  . Smoking status: Never Smoker   . Smokeless tobacco: Never Used  . Alcohol Use: 0.6 oz/week    1 Glasses of wine per week  . Drug Use: No  . Sexual Activity: No   Other Topics Concern  . None   Social History Narrative      Review of Systems patient currently denies fever, chills, headache, chest pain, significant dyspnea, cough, nausea/ vomiting . She does note swelling, erythema,  tenderness and heaviness of the left lower extremity; she also has history of menorrhagia, urinary frequency, and pelvic cramping.  Vital Signs: BP 139/97 mmHg  Pulse 107  Temp(Src) 98.5 F (36.9 C) (Oral)  Resp 18  Ht 5' 4"  (1.626 m)  Wt 181 lb 14.4 oz (82.509 kg)  BMI 31.21 kg/m2  SpO2 100%  LMP 03/07/2015  Physical Exam patient is awake, alert. Chest is clear to auscultation bilaterally. Heart-tachycardic but regular rhythm; abdomen soft, positive bowel sounds,  fibroid uterus, mild central pelvic tenderness to palpation, right lower extremity with no significant edema; left lower extremity with 2+ edema and associated erythema and medial inner left thigh tenderness to palpation; intact distal pulses.  Mallampati Score:     Imaging: Ct Angio Chest Pe W/cm &/or Wo Cm  04/03/2015  CLINICAL DATA:  Shortness of breath.  History of DVT. EXAM: CT ANGIOGRAPHY CHEST WITH CONTRAST TECHNIQUE: Multidetector CT imaging of the chest was performed using the standard protocol during bolus administration of intravenous contrast. Multiplanar CT image reconstructions and MIPs were obtained to evaluate the vascular anatomy. CONTRAST:  74m OMNIPAQUE IOHEXOL 350 MG/ML SOLN COMPARISON:  None. FINDINGS: Mediastinum: The heart size appears normal. No pericardial effusion identified. The trachea is patent and appears midline. Unremarkable appearance of the esophagus. The main pulmonary artery appears normal. There is no lobar or segmental pulmonary artery filling defects identified. 11 mm left axillary lymph node is identified. There is an indeterminate mixed attenuation structure within the right breast measuring 7.5 cm, image 34 of series 4. Lungs/Pleura: No pleural fluid identified. No airspace consolidation or atelectasis identified. No suspicious nodule or mass noted. Upper Abdomen: There is no focal liver abnormality. The gallbladder is unremarkable. Normal appearance of the pancreas. The visualized portions of the spleen are unremarkable. Low density nodule in the left adrenal gland compatible with a benign adenoma measures 3.2 cm. There appears to be dilatation of the upper pole collecting system of the right kidney. This is only partially visualized. Musculoskeletal: No aggressive lytic or sclerotic bone lesions identified. No acute bone abnormalities noted. Review of the MIP images confirms the above findings. IMPRESSION: 1. No evidence for acute pulmonary embolus. 2. 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 3. There is a left axillary lymph node which is upper limits of normal in size measuring 11 mm. Electronically Signed   By: TKerby MoorsM.D.   On: 04/03/2015 16:28   UKoreaVenous Img Lower Unilateral Left  04/03/2015  CLINICAL DATA:  39year old female with a history of left thigh pain for 3 days. Prior MRI demonstrates significant uterine fibroids. EXAM: LEFT LOWER EXTREMITY VENOUS DOPPLER ULTRASOUND TECHNIQUE: Gray-scale sonography with graded compression, as well as color Doppler and duplex ultrasound were performed to evaluate the lower extremity deep venous systems from the level of the common femoral vein and including the common femoral, femoral, profunda femoral, popliteal and calf veins including the posterior tibial, peroneal and gastrocnemius veins when visible. The superficial great saphenous vein was also interrogated. Spectral Doppler was utilized to evaluate flow at rest and with distal augmentation maneuvers in the common femoral, femoral and popliteal veins. COMPARISON:  Prior MR 02/09/2015 FINDINGS: Contralateral Common Femoral Vein: Respiratory phasicity is normal and symmetric with the symptomatic side. No evidence of thrombus. Normal compressibility. Common Femoral Vein: Thrombosis of the left common femoral vein Saphenofemoral Junction: Thrombosis of the saphenous femoral junction Femoral  Vein: Complete thrombosis of the femoral vein extending into the popliteal vein Popliteal Vein: Thrombosis of the popliteal vein extending into the tibial veins. Calf Veins: Evidence of proximal peroneal thrombosis. Superficial Great Saphenous Vein: GSV demonstrates superficial thrombus. Other Findings:  None. IMPRESSION: Sonographic survey demonstrates extensive left-sided acute DVT involving common femoral vein, femoral vein,  popliteal vein, extending into the tibial veins. There is likely proximal extension into the pelvis, with the most likely etiology related to compression from the patient's enlarged, fibroid uterus demonstrated on prior MR. These results were called by telephone at the time of interpretation on 04/03/2015 at 7:55 am to Dr. Fuller Plan who verbally acknowledged these results. Signed, Dulcy Fanny. Earleen Newport, DO [facility] Electronically Signed   By: Corrie Mckusick D.O.   On: 04/03/2015 07:55    Labs:  CBC:  Recent Labs  04/02/15 2325 04/03/15 0446  WBC 12.0* 10.1  HGB 8.0* 8.2*  HCT 27.3* 26.9*  PLT 241 211    COAGS:  Recent Labs  04/02/15 2325  INR 1.10  APTT 26    BMP:  Recent Labs  04/02/15 2325 04/03/15 0446  NA 135 137  K 3.3* 3.7  CL 104 105  CO2 23 25  GLUCOSE 108* 136*  BUN 14 14  CALCIUM 9.2 9.2  CREATININE 0.95 0.95  GFRNONAA >60 >60  GFRAA >60 >60    LIVER FUNCTION TESTS: No results for input(s): BILITOT, AST, ALT, ALKPHOS, PROT, ALBUMIN in the last 8760 hours.  TUMOR MARKERS: No results for input(s): AFPTM, CEA, CA199, CHROMGRNA in the last 8760 hours.  Assessment and Plan: Patient with history of symptomatic uterine fibroids, anemia, and now with acute extensive left lower extremity DVT most likely secondary to compression from fibroid uterus. Request received for consideration of thrombolytic therapy to the left lower extremity DVT. Imaging studies have been reviewed by Dr. Earleen Newport. Details and associated risks of venous thrombolytic therapy briefly discussed with patient and family. At this time recommend continued Lovenox therapy until further discussions are held with Dr. Priscille Heidelberg regarding upcoming myomectomy. Additional recommendations will be made by Dr. Earleen Newport following this discussion.     Thank you for this interesting consult.  I greatly enjoyed meeting MAKAYLIE DEDEAUX and look forward to  participating in their care.  A copy of this report was sent to the requesting provider on this date.  Signed: D. Rowe Robert 04/03/2015, 5:00 PM   I spent a total of 20 minutes in face to face in clinical consultation, greater than 50% of which was counseling/coordinating care for thrombolytic therapy of left lower extremity DVT

## 2015-04-03 NOTE — Progress Notes (Signed)
ANTICOAGULATION CONSULT NOTE - Initial Consult  Pharmacy Consult for Lovenox Indication: DVT  No Known Allergies  Patient Measurements: Height: 5' 4"  (162.6 cm) Weight: 184 lb (83.462 kg) IBW/kg (Calculated) : 54.7  Vital Signs: Temp: 98.8 F (37.1 C) (11/22 0007) Temp Source: Oral (11/22 0007) BP: 143/92 mmHg (11/22 0007) Pulse Rate: 86 (11/22 0007)  Labs:  Recent Labs  04/02/15 2325  HGB 8.0*  HCT 27.3*  PLT 241  APTT 26  LABPROT 14.4  INR 1.10  CREATININE 0.95    Estimated Creatinine Clearance: 83.1 mL/min (by C-G formula based on Cr of 0.95).   Medical History: Past Medical History  Diagnosis Date  . Anemia   . Headache(784.0)   . Iron deficiency anemia due to chronic blood loss 08/07/2011    Medications:  Prescriptions prior to admission  Medication Sig Dispense Refill Last Dose  . HYDROcodone-acetaminophen (NORCO/VICODIN) 5-325 MG tablet Take 1 tablet by mouth every 6 (six) hours as needed for moderate pain.   Past Week at Unknown time  . ibuprofen (ADVIL,MOTRIN) 200 MG tablet Take 400 mg by mouth every 6 (six) hours as needed for moderate pain.   Past Month at Unknown time  . furosemide (LASIX) 20 MG tablet Take 1 tablet (20 mg total) by mouth 2 (two) times daily. 30 tablet 0   . ondansetron (ZOFRAN-ODT) 8 MG disintegrating tablet Take 8 mg by mouth every 8 (eight) hours as needed for nausea or vomiting.   Completed Course at Unknown time  . potassium chloride SA (K-DUR,KLOR-CON) 20 MEQ tablet Take 1 tablet (20 mEq total) by mouth daily. 30 tablet 0    Scheduled:  . enoxaparin (LOVENOX) injection  80 mg Subcutaneous Q12H  . potassium chloride SA  20 mEq Oral Daily  . sodium chloride  3 mL Intravenous Q12H   Infusions:    Assessment: 35 yoF admitted with LLE DVT.   Lovenox per Rx. Goal of Therapy:  Anti-Xa level 0.6-1 units/ml 4hrs after LMWH dose given    Plan:   Lovenox 52m SQ q12h  F/u Scr/levels as needed  GLawana Pai R 04/03/2015,3:18 AM

## 2015-04-04 LAB — PROTIME-INR
INR: 1.15 (ref 0.00–1.49)
PROTHROMBIN TIME: 14.9 s (ref 11.6–15.2)

## 2015-04-04 NOTE — Progress Notes (Signed)
[facility] PROGRESS NOTE  Christine Jordan NFA:[id number] DOB: [birth year] DOA: 04/02/2015 PCP: Marylynn Pearson, MD  HPI/Brief narrative 39 year old female with past medical history of uterine fibroids and iron deficiency anemia; who presented to the emergency department at [facility] [place] [facility] with complaints of left lower extremity swelling and pain over the 4-5 days prior to admit. Patient was found to have large L LE DVT. Patient was admitted for further work up.  Assessment/Plan: DVT (deep venous thrombosis), left: Acute. Patient with symptoms and a ultrasound which was positive for a left DVT. CTA neg for PE. Discussions with interventional radiology at 8am revealed extensive clot that may be amenable lysis. Also suspect clot could be related to uterine fibroid -Appreciate IR input. Recs for catheter directed thrombolysis 48hrs before surgery then 24hrs off tPA before debulking surgery with consideration for repeat thrombectomy after surgery. Have discussed case with pt's primary GYN, Dr. Posey Pronto, who will discuss options with patient before formally coordinating care  Iron deficiency anemia due to chronic blood loss: Chronic. Patient with a hemoglobin of 8 and hematocrit of 27.3 patient's symptoms of shortness of breath on exertion could possibly be related to her anemia in part versus DVT/PE - Monitor CBC  Fibroids, submucosal: Stable. Patient reports a upcoming surgery for removal of the fibroid in January. See above  Nausea: Acute on chronic. -prn zofran  Hypokalemia: Acute. - Replaced  Code Status: Full Family Communication: Pt in room Disposition Plan: Pending discussion with Dr. Posey Pronto and coordinating with IR   Consultants:  IR  Dr. Posey Pronto (pt's gyn over phone)  Procedures:    Antibiotics: Anti-infectives    None       HPI/Subjective: No complaints at present  Objective: Filed Vitals:   04/03/15 1434 04/03/15 2155 04/04/15 0521 04/04/15 1449  BP:  139/97 127/83 135/88 126/90  Pulse: 107 106 113 104  Temp: 98.5 F (36.9 C) 98.9 F (37.2 C) 100 F (37.8 C) 98.8 F (37.1 C)  TempSrc: Oral Oral Oral Oral  Resp: 18 18 18 18   Height:      Weight:      SpO2: 100% 100% 99% 99%    Intake/Output Summary (Last 24 hours) at 04/04/15 1718 Last data filed at 04/04/15 1300  Gross per 24 hour  Intake    240 ml  Output      0 ml  Net    240 ml   Filed Weights   04/02/15 2138 04/03/15 0245  Weight: 83.462 kg (184 lb) 82.509 kg (181 lb 14.4 oz)    Exam:   General:  Awake, in nad  Cardiovascular: regular, s1, s2  Respiratory: normal resp effort, no wheezing  Abdomen: soft, nondistended  Musculoskeletal: perfused, no clubbing   Data Reviewed: Basic Metabolic Panel:  Recent Labs Lab 04/02/15 2325 04/03/15 0446  NA 135 137  K 3.3* 3.7  CL 104 105  CO2 23 25  GLUCOSE 108* 136*  BUN 14 14  CREATININE 0.95 0.95  CALCIUM 9.2 9.2   Liver Function Tests: No results for input(s): AST, ALT, ALKPHOS, BILITOT, PROT, ALBUMIN in the last 168 hours. No results for input(s): LIPASE, AMYLASE in the last 168 hours. No results for input(s): AMMONIA in the last 168 hours. CBC:  Recent Labs Lab 04/02/15 2325 04/03/15 0446  WBC 12.0* 10.1  NEUTROABS 8.7*  --   HGB 8.0* 8.2*  HCT 27.3* 26.9*  MCV 72.6* 72.3*  PLT 241 211   Cardiac Enzymes: No results for input(s): CKTOTAL, CKMB,  CKMBINDEX, TROPONINI in the last 168 hours. BNP (last 3 results) No results for input(s): BNP in the last 8760 hours.  ProBNP (last 3 results) No results for input(s): PROBNP in the last 8760 hours.  CBG: No results for input(s): GLUCAP in the last 168 hours.  No results found for this or any previous visit (from the past 240 hour(s)).   Studies: Ct Angio Chest Pe W/cm &/or Wo Cm  04/03/2015  CLINICAL DATA:  Shortness of breath.  History of DVT. EXAM: CT ANGIOGRAPHY CHEST WITH CONTRAST TECHNIQUE: Multidetector CT imaging of the chest was  performed using the standard protocol during bolus administration of intravenous contrast. Multiplanar CT image reconstructions and MIPs were obtained to evaluate the vascular anatomy. CONTRAST:  30m OMNIPAQUE IOHEXOL 350 MG/ML SOLN COMPARISON:  None. FINDINGS: Mediastinum: The heart size appears normal. No pericardial effusion identified. The trachea is patent and appears midline. Unremarkable appearance of the esophagus. The main pulmonary artery appears normal. There is no lobar or segmental pulmonary artery filling defects identified. 11 mm left axillary lymph node is identified. There is an indeterminate mixed attenuation structure within the right breast measuring 7.5 cm, image 34 of series 4. Lungs/Pleura: No pleural fluid identified. No airspace consolidation or atelectasis identified. No suspicious nodule or mass noted. Upper Abdomen: There is no focal liver abnormality. The gallbladder is unremarkable. Normal appearance of the pancreas. The visualized portions of the spleen are unremarkable. Low density nodule in the left adrenal gland compatible with a benign adenoma measures 3.2 cm. There appears to be dilatation of the upper pole collecting system of the right kidney. This is only partially visualized. Musculoskeletal: No aggressive lytic or sclerotic bone lesions identified. No acute bone abnormalities noted. Review of the MIP images confirms the above findings. IMPRESSION: 1. No evidence for acute pulmonary embolus. 2. 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 3. There is a left axillary lymph node which is upper limits of normal in size measuring 11 mm. Electronically Signed   By: TKerby MoorsM.D.   On: 04/03/2015 16:28   UKoreaVenous Img Lower Unilateral Left  04/03/2015  CLINICAL DATA:  39year old female with a history of left thigh pain for 3 days.  Prior MRI demonstrates significant uterine fibroids. EXAM: LEFT LOWER EXTREMITY VENOUS DOPPLER ULTRASOUND TECHNIQUE: Gray-scale sonography with graded compression, as well as color Doppler and duplex ultrasound were performed to evaluate the lower extremity deep venous systems from the level of the common femoral vein and including the common femoral, femoral, profunda femoral, popliteal and calf veins including the posterior tibial, peroneal and gastrocnemius veins when visible. The superficial great saphenous vein was also interrogated. Spectral Doppler was utilized to evaluate flow at rest and with distal augmentation maneuvers in the common femoral, femoral and popliteal veins. COMPARISON:  Prior MR 02/09/2015 FINDINGS: Contralateral Common Femoral Vein: Respiratory phasicity is normal and symmetric with the symptomatic side. No evidence of thrombus. Normal compressibility. Common Femoral Vein: Thrombosis of the left common femoral vein Saphenofemoral Junction: Thrombosis of the saphenous femoral junction Femoral Vein: Complete thrombosis of the femoral vein extending into the popliteal vein Popliteal Vein: Thrombosis of the popliteal vein extending into the tibial veins. Calf Veins: Evidence of proximal peroneal thrombosis. Superficial Great Saphenous Vein: GSV demonstrates superficial thrombus. Other Findings:  None. IMPRESSION: Sonographic survey demonstrates extensive left-sided acute DVT involving common femoral vein, femoral vein, popliteal vein, extending into  the tibial veins. There is likely proximal extension into the pelvis, with the most likely etiology related to compression from the patient's enlarged, fibroid uterus demonstrated on prior MR. These results were called by telephone at the time of interpretation on 04/03/2015 at 7:55 am to Dr. Fuller Plan who verbally acknowledged these results. Signed, Dulcy Fanny. Earleen Newport, DO [facility]  Electronically Signed   By: Corrie Mckusick D.O.   On: 04/03/2015 07:55    Scheduled Meds: . enoxaparin (LOVENOX) injection  80 mg Subcutaneous Q12H  . sodium chloride  3 mL Intravenous Q12H   Continuous Infusions:   Principal Problem:   DVT (deep venous thrombosis), left Active Problems:   Iron deficiency anemia due to chronic blood loss   Fibroids, submucosal   SOB (shortness of breath) on exertion   Dyspnea on exertion   Jannette Cotham K  [facility] Pager [phone]. If 7PM-7AM, please contact night-coverage at [email or web address], password [facility] 04/04/2015, 5:18 PM  LOS: 1 day

## 2015-04-05 LAB — CBC
HEMATOCRIT: 26.5 % — AB (ref 36.0–46.0)
Hemoglobin: 7.9 g/dL — ABNORMAL LOW (ref 12.0–15.0)
MCH: 21.4 pg — ABNORMAL LOW (ref 26.0–34.0)
MCHC: 29.8 g/dL — AB (ref 30.0–36.0)
MCV: 71.8 fL — AB (ref 78.0–100.0)
PLATELETS: 287 10*3/uL (ref 150–400)
RBC: 3.69 MIL/uL — ABNORMAL LOW (ref 3.87–5.11)
RDW: 17.4 % — AB (ref 11.5–15.5)
WBC: 8.8 10*3/uL (ref 4.0–10.5)

## 2015-04-05 LAB — ANTITHROMBIN III: ANTITHROMB III FUNC: 99 % (ref 75–120)

## 2015-04-05 LAB — PROTIME-INR
INR: 1.11 (ref 0.00–1.49)
Prothrombin Time: 14.5 seconds (ref 11.6–15.2)

## 2015-04-05 NOTE — Progress Notes (Signed)
ANTICOAGULATION CONSULT NOTE   Pharmacy Consult for enoxaparin Indication: DVT  No Known Allergies  Patient Measurements: Height: 5' 4"  (162.6 cm) Weight: 181 lb 14.4 oz (82.509 kg) IBW/kg (Calculated) : 54.7  Vital Signs: Temp: 98.2 F (36.8 C) (11/24 0652) Temp Source: Oral (11/24 0652) BP: 126/89 mmHg (11/24 0652) Pulse Rate: 116 (11/24 0652)  Labs:  Recent Labs  04/02/15 2325 04/03/15 0446 04/04/15 0538 04/05/15 0500  HGB 8.0* 8.2*  --  7.9*  HCT 27.3* 26.9*  --  26.5*  PLT 241 211  --  287  APTT 26  --   --   --   LABPROT 14.4  --  14.9 14.5  INR 1.10  --  1.15 1.11  CREATININE 0.95 0.95  --   --     Estimated Creatinine Clearance: 82.6 mL/min (by C-G formula based on Cr of 0.95).    Assessment: 3 yoF with history of symptomatic uterine fibroids and prior myomectomy in 2013 presented with acute extensive left lower extremity DVT, etiology related to compression from the patient's enlarged fibroid uterus.  She is tentatively scheduled for additional myomectomy on 06/12/2015.  CT angio of chest was negative for PE.  Pharmacy currently dosing treatment Lovenox.  IR consulted for consideration of thrombolytic therapy.  IR recommended continuing with Lovenox for now until further discussions held regarding upcoming myomectomy.  Today, 04/05/2015  Hgb low/stable, pltc WNL  Renal fx stable and CrC; > 42m/min  Goal of Therapy:  INR 2-3   Plan:   Continue Lovenox 80 mg (1 mg/kg) SQ q12h while awaiting plans for possible thrombolysis/thrombectomy [facility] at least q72h while on LMWH in hospital   DDoreene Eland PharmD, BCPS.   Pager: [phone]/24/2016 11:07 AM

## 2015-04-05 NOTE — Progress Notes (Signed)
[facility] PROGRESS NOTE  Christine Jordan TDD:[id number] DOB: 1976/01/26 DOA: 04/02/2015 PCP: Marylynn Pearson, MD  HPI/Brief narrative 39 year old female with past medical history of uterine fibroids and iron deficiency anemia; who presented to the emergency department at [facility] [place] with complaints of left lower extremity swelling and pain over the 4-5 days prior to admit. Patient was found to have large L LE DVT. Patient was admitted for further work up.  Assessment/Plan: DVT (deep venous thrombosis), left: Acute. Patient with symptoms and a ultrasound which was positive for a left DVT. CTA neg for PE. Discussions with interventional radiology at 8am revealed extensive clot that may be amenable lysis. Also suspect clot could be related to uterine fibroid -Appreciate IR input. Recs for catheter directed thrombolysis 48hrs before surgery then 24hrs off tPA before debulking surgery with consideration for repeat thrombectomy after surgery.  -Have further discussed case with pt's GYN provider, Dr. Posey Pronto, who will confer with other specialists at [facility] before deciding on course of plan. Plan to speak with Dr. Posey Pronto again on 11/25 -For now, will continue on lovenox until plan of care better formulated  Iron deficiency anemia due to chronic blood loss:  - Monitor CBC - Pt reports continued vaginal bleed, likely secondary to fibroids - Plan to transfuse to keep hgb >7  Fibroids, submucosal: Pt has upcoming surgery for removal of the fibroid in January. See above.   Nausea: Acute on chronic. -prn zofran  Hypokalemia: Acute. - Replaced  Code Status: Full Family Communication: Pt in room Disposition Plan: Unknown - disposition pending discussion with Dr. Posey Pronto on 11/25   Consultants:  IR  Dr. Posey Pronto (pt's gyn over phone)  Procedures:    Antibiotics: Anti-infectives    None      HPI/Subjective: Patient is without complaints currently  Objective: Filed Vitals:    04/04/15 0521 04/04/15 1449 04/04/15 2233 04/05/15 0652  BP: 135/88 126/90 121/84 126/89  Pulse: 113 104 96 116  Temp: 100 F (37.8 C) 98.8 F (37.1 C) 98.2 F (36.8 C) 98.2 F (36.8 C)  TempSrc: Oral Oral Oral Oral  Resp: 18 18 18 18   Height:      Weight:      SpO2: 99% 99% 100% 100%    Intake/Output Summary (Last 24 hours) at 04/05/15 1414 Last data filed at 04/05/15 0500  Gross per 24 hour  Intake    480 ml  Output      0 ml  Net    480 ml   Filed Weights   04/02/15 2138 04/03/15 0245  Weight: 83.462 kg (184 lb) 82.509 kg (181 lb 14.4 oz)    Exam:   General:  Awake, laying in bed, in nad  Cardiovascular: regular, s1, s2  Respiratory: normal resp effort, no wheezing  Abdomen: soft, nondistended  Musculoskeletal: perfused, no clubbing, no cyanosis  Data Reviewed: Basic Metabolic Panel:  Recent Labs Lab 04/02/15 2325 04/03/15 0446  NA 135 137  K 3.3* 3.7  CL 104 105  CO2 23 25  GLUCOSE 108* 136*  BUN 14 14  CREATININE 0.95 0.95  CALCIUM 9.2 9.2   Liver Function Tests: No results for input(s): AST, ALT, ALKPHOS, BILITOT, PROT, ALBUMIN in the last 168 hours. No results for input(s): LIPASE, AMYLASE in the last 168 hours. No results for input(s): AMMONIA in the last 168 hours. CBC:  Recent Labs Lab 04/02/15 2325 04/03/15 0446 04/05/15 0500  WBC 12.0* 10.1 8.8  NEUTROABS 8.7*  --   --  HGB 8.0* 8.2* 7.9*  HCT 27.3* 26.9* 26.5*  MCV 72.6* 72.3* 71.8*  PLT 241 211 287   Cardiac Enzymes: No results for input(s): CKTOTAL, CKMB, CKMBINDEX, TROPONINI in the last 168 hours. BNP (last 3 results) No results for input(s): BNP in the last 8760 hours.  ProBNP (last 3 results) No results for input(s): PROBNP in the last 8760 hours.  CBG: No results for input(s): GLUCAP in the last 168 hours.  No results found for this or any previous visit (from the past 240 hour(s)).   Studies: Ct Angio Chest Pe W/cm &/or Wo Cm  04/03/2015  CLINICAL DATA:   Shortness of breath.  History of DVT. EXAM: CT ANGIOGRAPHY CHEST WITH CONTRAST TECHNIQUE: Multidetector CT imaging of the chest was performed using the standard protocol during bolus administration of intravenous contrast. Multiplanar CT image reconstructions and MIPs were obtained to evaluate the vascular anatomy. CONTRAST:  4m OMNIPAQUE IOHEXOL 350 MG/ML SOLN COMPARISON:  None. FINDINGS: Mediastinum: The heart size appears normal. No pericardial effusion identified. The trachea is patent and appears midline. Unremarkable appearance of the esophagus. The main pulmonary artery appears normal. There is no lobar or segmental pulmonary artery filling defects identified. 11 mm left axillary lymph node is identified. There is an indeterminate mixed attenuation structure within the right breast measuring 7.5 cm, image 34 of series 4. Lungs/Pleura: No pleural fluid identified. No airspace consolidation or atelectasis identified. No suspicious nodule or mass noted. Upper Abdomen: There is no focal liver abnormality. The gallbladder is unremarkable. Normal appearance of the pancreas. The visualized portions of the spleen are unremarkable. Low density nodule in the left adrenal gland compatible with a benign adenoma measures 3.2 cm. There appears to be dilatation of the upper pole collecting system of the right kidney. This is only partially visualized. Musculoskeletal: No aggressive lytic or sclerotic bone lesions identified. No acute bone abnormalities noted. Review of the MIP images confirms the above findings. IMPRESSION: 1. No evidence for acute pulmonary embolus. 2. 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 3. There is a left axillary lymph node which is upper limits of normal in size measuring 11 mm. Electronically Signed   By: TKerby MoorsM.D.   On: 04/03/2015 16:28     Scheduled Meds: . enoxaparin (LOVENOX) injection  80 mg Subcutaneous Q12H  . sodium chloride  3 mL Intravenous Q12H   Continuous Infusions:   Principal Problem:   DVT (deep venous thrombosis), left Active Problems:   Iron deficiency anemia due to chronic blood loss   Fibroids, submucosal   SOB (shortness of breath) on exertion   Dyspnea on exertion   Harlan Vinal K  [facility] Pager [phone] If 7PM-7AM, please contact night-coverage at [email or web address], password [email or web address]11/24/2016, 2:14 PM  LOS: 2 days

## 2015-04-06 LAB — CBC
HEMATOCRIT: 25.9 % — AB (ref 36.0–46.0)
HEMOGLOBIN: 7.7 g/dL — AB (ref 12.0–15.0)
MCH: 21.7 pg — ABNORMAL LOW (ref 26.0–34.0)
MCHC: 29.7 g/dL — AB (ref 30.0–36.0)
MCV: 73 fL — ABNORMAL LOW (ref 78.0–100.0)
Platelets: 313 10*3/uL (ref 150–400)
RBC: 3.55 MIL/uL — ABNORMAL LOW (ref 3.87–5.11)
RDW: 17.7 % — ABNORMAL HIGH (ref 11.5–15.5)
WBC: 8.1 10*3/uL (ref 4.0–10.5)

## 2015-04-06 MED ORDER — FERROUS SULFATE 325 (65 FE) MG PO TABS
325.0000 mg | ORAL_TABLET | Freq: Every day | ORAL | Status: DC
  Administered 2015-04-06 – 2015-04-07 (×2): 325 mg via ORAL
  Filled 2015-04-06 (×2): qty 1

## 2015-04-06 MED ORDER — DOCUSATE SODIUM 100 MG PO CAPS
100.0000 mg | ORAL_CAPSULE | Freq: Every day | ORAL | Status: DC
  Administered 2015-04-06 – 2015-04-07 (×2): 100 mg via ORAL
  Filled 2015-04-06 (×2): qty 1

## 2015-04-06 NOTE — Care Management Note (Signed)
Case Management Note  Patient Details  Name: Christine Jordan MRN: [id number] Date of Birth: 1975-11-11  Subjective/Objective:    Nurse given eliquis 30 day free trial discount card-only if she d/c's home. If patient transfers to [facility] then card not to be given.                Action/Plan:d/c plan home or Acute to acute transfer.   Expected Discharge Date:                  Expected Discharge Plan:  Home/Self Care  In-House Referral:     Discharge planning Services  CM Consult  Post Acute Care Choice:    Choice offered to:     DME Arranged:    DME Agency:     HH Arranged:    HH Agency:     Status of Service:  In process, will continue to follow  Medicare Important Message Given:    Date Medicare IM Given:    Medicare IM give by:    Date Additional Medicare IM Given:    Additional Medicare Important Message give by:     If discussed at [place] of Stay Meetings, dates discussed:    Additional Comments:  Dessa Phi, RN 04/06/2015, 3:42 PM

## 2015-04-06 NOTE — Progress Notes (Addendum)
[facility] PROGRESS NOTE  Christine Jordan HUD:[id number] DOB: Nov 17, 1975 DOA: 04/02/2015 PCP: Marylynn Pearson, MD  HPI/Brief narrative 39 year old female with past medical history of uterine fibroids and iron deficiency anemia; who presented to the emergency department at [facility] [place] with complaints of left lower extremity swelling and pain over the 4-5 days prior to admit. Patient was found to have large L LE DVT. Patient was admitted for further work up.  Assessment/Plan: DVT (deep venous thrombosis), left: Acute. Patient with symptoms and a ultrasound which was positive for a left DVT. CTA neg for PE. Discussions with interventional radiology at 8am revealed extensive clot that may be amenable lysis. Also suspect clot could be related to uterine fibroid -Appreciate IR input. Recs for catheter directed thrombolysis 48hrs before surgery then 24hrs off tPA before debulking surgery with consideration for repeat thrombectomy after surgery.  -Appreciate input by pt's GYN provider, Dr. Posey Pronto, who will confer with other specialists at [facility] before deciding on course of plan. Plan to speak with Dr. Posey Pronto again later today, however pt states Dr. Posey Pronto has been calling today -For now, will continue on lovenox until plan of care better formulated  Iron deficiency anemia due to chronic blood loss:  - Monitor CBC - Pt states minimal vaginal bleeding today - Plan to transfuse to keep hgb >7 - Will start PO iron supplementation with stool softner  Fibroids, submucosal: Pt has upcoming surgery for removal of the fibroid in January. See above.   Nausea: Acute on chronic. -prn zofran  Hypokalemia: Acute. - Replaced  Code Status: Full Family Communication: Pt in room, family at bedside Disposition Plan: Unknown - disposition pending discussion with Dr. Posey Pronto on 11/25   Consultants:  IR  Dr. Posey Pronto (pt's gyn over phone)  Procedures:    Antibiotics: Anti-infectives    None       HPI/Subjective: Denies abd pain. Denies significant vaginal bleeding  Objective: Filed Vitals:   04/05/15 1500 04/05/15 2024 04/06/15 0526 04/06/15 1337  BP: 125/80 127/99 134/89 134/87  Pulse: 99 108 107 99  Temp: 98.3 F (36.8 C) 98.4 F (36.9 C) 98.8 F (37.1 C) 98.5 F (36.9 C)  TempSrc: Oral Oral Oral Oral  Resp: 18 18 18 18   Height:      Weight:      SpO2: 100% 98% 99% 100%    Intake/Output Summary (Last 24 hours) at 04/06/15 1449 Last data filed at 04/06/15 8588  Gross per 24 hour  Intake    603 ml  Output      0 ml  Net    603 ml   Filed Weights   04/02/15 2138 04/03/15 0245  Weight: 83.462 kg (184 lb) 82.509 kg (181 lb 14.4 oz)    Exam:   General:  Awake, laying in bed, conversant, in nad  Cardiovascular: regular, s1, s2  Respiratory: normal resp effort, no wheezing  Abdomen: soft, nondistended  Musculoskeletal: perfused, no clubbing, no cyanosis  Data Reviewed: Basic Metabolic Panel:  Recent Labs Lab 04/02/15 2325 04/03/15 0446  NA 135 137  K 3.3* 3.7  CL 104 105  CO2 23 25  GLUCOSE 108* 136*  BUN 14 14  CREATININE 0.95 0.95  CALCIUM 9.2 9.2   Liver Function Tests: No results for input(s): AST, ALT, ALKPHOS, BILITOT, PROT, ALBUMIN in the last 168 hours. No results for input(s): LIPASE, AMYLASE in the last 168 hours. No results for input(s): AMMONIA in the last 168 hours. CBC:  Recent Labs Lab 04/02/15  2325 04/03/15 0446 04/05/15 0500 04/06/15 0450  WBC 12.0* 10.1 8.8 8.1  NEUTROABS 8.7*  --   --   --   HGB 8.0* 8.2* 7.9* 7.7*  HCT 27.3* 26.9* 26.5* 25.9*  MCV 72.6* 72.3* 71.8* 73.0*  PLT 241 211 287 313   Cardiac Enzymes: No results for input(s): CKTOTAL, CKMB, CKMBINDEX, TROPONINI in the last 168 hours. BNP (last 3 results) No results for input(s): BNP in the last 8760 hours.  ProBNP (last 3 results) No results for input(s): PROBNP in the last 8760 hours.  CBG: No results for input(s): GLUCAP in the last 168  hours.  No results found for this or any previous visit (from the past 240 hour(s)).   Studies: No results found.  Scheduled Meds: . enoxaparin (LOVENOX) injection  80 mg Subcutaneous Q12H  . sodium chloride  3 mL Intravenous Q12H   Continuous Infusions:   Principal Problem:   DVT (deep venous thrombosis), left Active Problems:   Iron deficiency anemia due to chronic blood loss   Fibroids, submucosal   SOB (shortness of breath) on exertion   Dyspnea on exertion   CHIU, STEPHEN K  [facility] Pager [phone]. If 7PM-7AM, please contact night-coverage at [email or web address], password [facility] 04/06/2015, 2:49 PM  LOS: 3 days

## 2015-04-07 LAB — CBC
HCT: 25.8 % — ABNORMAL LOW (ref 36.0–46.0)
Hemoglobin: 7.5 g/dL — ABNORMAL LOW (ref 12.0–15.0)
MCH: 21 pg — AB (ref 26.0–34.0)
MCHC: 29.1 g/dL — AB (ref 30.0–36.0)
MCV: 72.3 fL — ABNORMAL LOW (ref 78.0–100.0)
PLATELETS: 333 10*3/uL (ref 150–400)
RBC: 3.57 MIL/uL — ABNORMAL LOW (ref 3.87–5.11)
RDW: 17.4 % — AB (ref 11.5–15.5)
WBC: 9.3 10*3/uL (ref 4.0–10.5)

## 2015-04-07 LAB — BETA-2-GLYCOPROTEIN I ABS, IGG/M/A

## 2015-04-07 LAB — CARDIOLIPIN ANTIBODIES, IGG, IGM, IGA

## 2015-04-07 MED ORDER — APIXABAN 5 MG PO TABS: 5.0000 mg | ORAL_TABLET | Freq: Two times a day (BID) | ORAL | Status: AC

## 2015-04-07 MED ORDER — FERROUS SULFATE 325 (65 FE) MG PO TABS: 325.0000 mg | ORAL_TABLET | Freq: Two times a day (BID) | ORAL | Status: AC

## 2015-04-07 MED ORDER — DOCUSATE SODIUM 100 MG PO CAPS: 100.0000 mg | ORAL_CAPSULE | Freq: Two times a day (BID) | ORAL | Status: AC

## 2015-04-07 NOTE — Discharge Summary (Signed)
Physician Discharge Summary  NEGIN HEGG NID:[id number] DOB: 06/06/1975 DOA: 04/02/2015  PCP: Marylynn Pearson, MD  Admit date: 04/02/2015 Discharge date: 04/07/2015  Time spent: 20 minutes  Recommendations for Outpatient Follow-up:  1. Follow up with PCP in 2-3 weeks 2. Follow up with GYN, to be scheduled on discharge   Discharge Diagnoses:  Principal Problem:   DVT (deep venous thrombosis), left Active Problems:   Iron deficiency anemia due to chronic blood loss   Fibroids, submucosal   SOB (shortness of breath) on exertion   Dyspnea on exertion   Discharge Condition: Stable  Diet recommendation: Regular  Filed Weights   04/02/15 2138 04/03/15 0245  Weight: 83.462 kg (184 lb) 82.509 kg (181 lb 14.4 oz)    History of present illness:  Please review dictated H and P from 11/22 for details. Briefly, 39 year old female with past medical history of uterine fibroids and iron deficiency anemia; who presented to the emergency department at [facility] with complaints of left lower extremity swelling and pain over the 4-5 days prior to admit. Patient was found to have large L LE DVT. Patient was admitted for further work up.  Hospital Course:  DVT (deep venous thrombosis), left: Acute. Patient with symptoms and a ultrasound which was positive for a left DVT. CTA neg for PE. Discussions with interventional radiology revealed extensive clot that may be amenable lysis. Also suspect clot could be related to mass effect from uterine fibroid -Appreciate IR input. Per IR, catheter directed thrombolysis would be low yield unless the obstructive mass-effect from the uterus was relieved. Recs for catheter directed thrombolysis 48hrs before surgery then 24hrs off tPA before debulking surgery with consideration for repeat thrombectomy after surgery.  -Had discussed extensively with pt's GYN provider, Dr. Posey Pronto, who has conferred with other specialists at [facility]. Plans for very close  outpatient follow up with Dr. Wille Celeste (Gyn-Onc from [place] to continue patient on Eliquis per Dr. Posey Pronto. -Patient has remained medically stable thus far. Will d/c home with close outpatient follow up  Iron deficiency anemia due to chronic blood loss:  -Monitor CBC - Pt states minimal vaginal bleeding - Started PO iron supplementation with stool softner  Fibroids, submucosal: Per above. Patient states she has decided on hysterectomy  Nausea: Acute on chronic. -prn zofran  Hypokalemia: Acute. - Replaced  Consultations:  IR  Dr. Posey Pronto (pt's primary GYN over phone)  Discharge Exam: Filed Vitals:   04/06/15 0526 04/06/15 1337 04/06/15 2034 04/07/15 0529  BP: 134/89 134/87 123/84 132/95  Pulse: 107 99 112 97  Temp: 98.8 F (37.1 C) 98.5 F (36.9 C) 98.9 F (37.2 C) 98.9 F (37.2 C)  TempSrc: Oral Oral Oral Oral  Resp: 18 18 16 16   Height:      Weight:      SpO2: 99% 100% 98% 99%    General: awake, in nad Cardiovascular: regular, s1, s2 Respiratory: normal resp effort, no wheezing  Discharge Instructions     Medication List    STOP taking these medications        potassium chloride SA 20 MEQ tablet  Commonly known as:  K-DUR,KLOR-CON      TAKE these medications        apixaban 5 MG Tabs tablet  Commonly known as:  ELIQUIS  Take 1 tablet (5 mg total) by mouth 2 (two) times daily.     docusate sodium 100 MG capsule  Commonly known as:  COLACE  Take 1 capsule (100  mg total) by mouth 2 (two) times daily.     ferrous sulfate 325 (65 FE) MG tablet  Take 1 tablet (325 mg total) by mouth 2 (two) times daily with a meal.     furosemide 20 MG tablet  Commonly known as:  LASIX  Take 1 tablet (20 mg total) by mouth 2 (two) times daily.     HYDROcodone-acetaminophen 5-325 MG tablet  Commonly known as:  NORCO/VICODIN  Take 1 tablet by mouth every 6 (six) hours as needed for moderate pain.     ibuprofen 200 MG tablet  Commonly known as:   ADVIL,MOTRIN  Take 400 mg by mouth every 6 (six) hours as needed for moderate pain.     ondansetron 8 MG disintegrating tablet  Commonly known as:  ZOFRAN-ODT  Take 8 mg by mouth every 8 (eight) hours as needed for nausea or vomiting.       No Known Allergies     Follow-up Information    Follow up with KELLY, Phillips Hay, MD.   Specialty:  Oncology   Why:  You will be contacted to schedule an appointment   Contact information:   [place] [address] [phone]       Follow up with ADKINS,GRETCHEN, MD. Schedule an appointment as soon as possible for a visit in 2 weeks.   Specialty:  Obstetrics and Gynecology   Why:  Hospital follow up   Contact information:   [place], [place] [address] [phone]        The results of significant diagnostics from this hospitalization (including imaging, microbiology, ancillary and laboratory) are listed below for reference.    Significant Diagnostic Studies: Ct Angio Chest Pe W/cm &/or Wo Cm  04/03/2015  CLINICAL DATA:  Shortness of breath.  History of DVT. EXAM: CT ANGIOGRAPHY CHEST WITH CONTRAST TECHNIQUE: Multidetector CT imaging of the chest was performed using the standard protocol during bolus administration of intravenous contrast. Multiplanar CT image reconstructions and MIPs were obtained to evaluate the vascular anatomy. CONTRAST:  3m OMNIPAQUE IOHEXOL 350 MG/ML SOLN COMPARISON:  None. FINDINGS: Mediastinum: The heart size appears normal. No pericardial effusion identified. The trachea is patent and appears midline. Unremarkable appearance of the esophagus. The main pulmonary artery appears normal. There is no lobar or segmental pulmonary artery filling defects identified. 11 mm left axillary lymph node is identified. There is an indeterminate mixed attenuation structure within the right breast measuring 7.5 cm, image 34 of series 4. Lungs/Pleura: No pleural fluid identified. No  airspace consolidation or atelectasis identified. No suspicious nodule or mass noted. Upper Abdomen: There is no focal liver abnormality. The gallbladder is unremarkable. Normal appearance of the pancreas. The visualized portions of the spleen are unremarkable. Low density nodule in the left adrenal gland compatible with a benign adenoma measures 3.2 cm. There appears to be dilatation of the upper pole collecting system of the right kidney. This is only partially visualized. Musculoskeletal: No aggressive lytic or sclerotic bone lesions identified. No acute bone abnormalities noted. Review of the MIP images confirms the above findings. IMPRESSION: 1. No evidence for acute pulmonary embolus. 2. 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 3. There is a left axillary lymph node which is upper limits of normal in size measuring 11 mm. Electronically Signed   By: TKerby MoorsM.D.   On: 04/03/2015 16:28  US Venous Img Lower Unilateral Left  04/03/2015  CLINICAL DATA:  39 year old female with a history of left thigh pain for 3 days. Prior MRI demonstrates significant uterine fibroids. EXAM: LEFT LOWER EXTREMITY VENOUS DOPPLER ULTRASOUND TECHNIQUE: Gray-scale sonography with graded compression, as well as color Doppler and duplex ultrasound were performed to evaluate the lower extremity deep venous systems from the level of the common femoral vein and including the common femoral, femoral, profunda femoral, popliteal and calf veins including the posterior tibial, peroneal and gastrocnemius veins when visible. The superficial great saphenous vein was also interrogated. Spectral Doppler was utilized to evaluate flow at rest and with distal augmentation maneuvers in the common femoral, femoral and popliteal veins. COMPARISON:  Prior MR 02/09/2015 FINDINGS: Contralateral Common  Femoral Vein: Respiratory phasicity is normal and symmetric with the symptomatic side. No evidence of thrombus. Normal compressibility. Common Femoral Vein: Thrombosis of the left common femoral vein Saphenofemoral Junction: Thrombosis of the saphenous femoral junction Femoral Vein: Complete thrombosis of the femoral vein extending into the popliteal vein Popliteal Vein: Thrombosis of the popliteal vein extending into the tibial veins. Calf Veins: Evidence of proximal peroneal thrombosis. Superficial Great Saphenous Vein: GSV demonstrates superficial thrombus. Other Findings:  None. IMPRESSION: Sonographic survey demonstrates extensive left-sided acute DVT involving common femoral vein, femoral vein, popliteal vein, extending into the tibial veins. There is likely proximal extension into the pelvis, with the most likely etiology related to compression from the patient's enlarged, fibroid uterus demonstrated on prior MR. These results were called by telephone at the time of interpretation on 04/03/2015 at 7:55 am to Dr. Fuller Plan who verbally acknowledged these results. Signed, Dulcy Fanny. Earleen Newport, DO [facility] Electronically Signed   By: Corrie Mckusick D.O.   On: 04/03/2015 07:55    Microbiology: No results found for this or any previous visit (from the past 240 hour(s)).   Labs: Basic Metabolic Panel:  Recent Labs Lab 04/02/15 2325 04/03/15 0446  NA 135 137  K 3.3* 3.7  CL 104 105  CO2 23 25  GLUCOSE 108* 136*  BUN 14 14  CREATININE 0.95 0.95  CALCIUM 9.2 9.2   Liver Function Tests: No results for input(s): AST, ALT, ALKPHOS, BILITOT, PROT, ALBUMIN in the last 168 hours. No results for input(s): LIPASE, AMYLASE in the last 168 hours. No results for input(s): AMMONIA in the last 168 hours. CBC:  Recent Labs Lab 04/02/15 2325 04/03/15 0446 04/05/15 0500 04/06/15 0450 04/07/15 0416  WBC 12.0* 10.1 8.8 8.1 9.3  NEUTROABS  8.7*  --   --   --   --   HGB 8.0* 8.2* 7.9* 7.7* 7.5*  HCT 27.3* 26.9* 26.5* 25.9* 25.8*  MCV 72.6* 72.3* 71.8* 73.0* 72.3*  PLT 241 211 287 313 333   Cardiac Enzymes: No results for input(s): CKTOTAL, CKMB, CKMBINDEX, TROPONINI in the last 168 hours. BNP: BNP (last 3 results) No results for input(s): BNP in the last 8760 hours.  ProBNP (last 3 results) No results for input(s): PROBNP in the last 8760 hours.  CBG: No results for input(s): GLUCAP in the last 168 hours.   Signed:  Kymere Fullington, Orpah Melter  [facility] 04/07/2015, 1:14 PM

## 2015-04-07 NOTE — Progress Notes (Signed)
Patient was given discharge, medication, and f/u instructions, verbalized understanding, prescriptions and Eliquis drug card give, IV removed, pt's sister to transport home

## 2015-04-08 LAB — PROTEIN C, TOTAL: PROTEIN C, TOTAL: 93 % (ref 60–150)

## 2015-04-08 LAB — PROTEIN S ACTIVITY: Protein S Activity: 85 % (ref 63–140)

## 2015-04-08 LAB — PROTEIN S, TOTAL: Protein S Ag, Total: 150 % (ref 60–150)

## 2015-04-08 LAB — PROTEIN C ACTIVITY: PROTEIN C ACTIVITY: 120 % (ref 73–180)

## 2015-04-09 LAB — PTT-LA MIX: PTT-LA MIX: 51.1 s — AB (ref 0.0–40.6)

## 2015-04-09 LAB — LUPUS ANTICOAGULANT PANEL
DRVVT: 49.2 s — AB (ref 0.0–44.0)
PTT Lupus Anticoagulant: 51.3 s — ABNORMAL HIGH (ref 0.0–40.6)

## 2015-04-09 LAB — HEXAGONAL PHASE PHOSPHOLIPID: Hexagonal Phase Phospholipid: 7 s (ref 0–11)

## 2015-04-09 LAB — HOMOCYSTEINE: Homocysteine: 9.1 umol/L (ref 0.0–15.0)

## 2015-04-09 LAB — DRVVT MIX: dRVVT Mix: 42.5 s (ref 0.0–44.0)

## 2015-04-10 LAB — FACTOR 5 LEIDEN

## 2015-04-11 LAB — PROTHROMBIN GENE MUTATION

## 2015-06-04 ENCOUNTER — Other Ambulatory Visit (HOSPITAL_COMMUNITY): Payer: Self-pay | Admitting: *Deleted

## 2015-06-05 ENCOUNTER — Encounter (HOSPITAL_COMMUNITY)
Admission: RE | Admit: 2015-06-05 | Discharge: 2015-06-05 | Disposition: A | Discharge status: Home / Self Care | Payer: Managed Care, Other (non HMO) | Source: Ambulatory Visit | Attending: Obstetrics and Gynecology | Admitting: Obstetrics and Gynecology

## 2015-06-05 DIAGNOSIS — D649 Anemia, unspecified: Secondary | ICD-10-CM | POA: Diagnosis present

## 2015-06-05 MED ORDER — SODIUM CHLORIDE 0.9 % IV SOLN
510.0000 mg | INTRAVENOUS | Status: DC
  Administered 2015-06-05: 510 mg via INTRAVENOUS
  Filled 2015-06-05: qty 17

## 2015-06-08 ENCOUNTER — Encounter (HOSPITAL_COMMUNITY)
Admission: RE | Admit: 2015-06-08 | Discharge: 2015-06-08 | Disposition: A | Discharge status: Home / Self Care | Payer: Managed Care, Other (non HMO) | Source: Ambulatory Visit | Attending: Obstetrics and Gynecology | Admitting: Obstetrics and Gynecology

## 2015-06-08 DIAGNOSIS — D649 Anemia, unspecified: Secondary | ICD-10-CM | POA: Diagnosis not present

## 2015-06-08 MED ORDER — SODIUM CHLORIDE 0.9 % IV SOLN
510.0000 mg | INTRAVENOUS | Status: AC
  Administered 2015-06-08: 510 mg via INTRAVENOUS
  Filled 2015-06-08: qty 17

## 2015-06-12 DIAGNOSIS — D649 Anemia, unspecified: Secondary | ICD-10-CM | POA: Diagnosis not present

## 2016-07-19 ENCOUNTER — Encounter: Payer: Self-pay | Admitting: Emergency Medicine

## 2016-07-19 ENCOUNTER — Emergency Department (HOSPITAL_BASED_OUTPATIENT_CLINIC_OR_DEPARTMENT_OTHER)
Admission: EM | Admit: 2016-07-19 | Discharge: 2016-07-20 | Disposition: A | Discharge status: Home / Self Care | Payer: Managed Care, Other (non HMO) | Attending: Emergency Medicine | Admitting: Emergency Medicine

## 2016-07-19 ENCOUNTER — Emergency Department (HOSPITAL_BASED_OUTPATIENT_CLINIC_OR_DEPARTMENT_OTHER): Payer: Managed Care, Other (non HMO)

## 2016-07-19 DIAGNOSIS — R7989 Other specified abnormal findings of blood chemistry: Secondary | ICD-10-CM

## 2016-07-19 DIAGNOSIS — R0781 Pleurodynia: Secondary | ICD-10-CM | POA: Diagnosis not present

## 2016-07-19 DIAGNOSIS — M546 Pain in thoracic spine: Secondary | ICD-10-CM | POA: Insufficient documentation

## 2016-07-19 DIAGNOSIS — R0602 Shortness of breath: Secondary | ICD-10-CM | POA: Insufficient documentation

## 2016-07-19 LAB — CBC WITH DIFFERENTIAL/PLATELET
Basophils Absolute: 0 10*3/uL (ref 0.0–0.1)
Basophils Relative: 0 %
Eosinophils Absolute: 0.3 10*3/uL (ref 0.0–0.7)
Eosinophils Relative: 3 %
HCT: 35.1 % — ABNORMAL LOW (ref 36.0–46.0)
Hemoglobin: 11.2 g/dL — ABNORMAL LOW (ref 12.0–15.0)
Lymphocytes Relative: 30 %
Lymphs Abs: 3 10*3/uL (ref 0.7–4.0)
MCH: 26 pg (ref 26.0–34.0)
MCHC: 31.9 g/dL (ref 30.0–36.0)
MCV: 81.6 fL (ref 78.0–100.0)
Monocytes Absolute: 0.7 10*3/uL (ref 0.1–1.0)
Monocytes Relative: 7 %
Neutro Abs: 5.9 10*3/uL (ref 1.7–7.7)
Neutrophils Relative %: 60 %
Platelets: 245 10*3/uL (ref 150–400)
RBC: 4.3 MIL/uL (ref 3.87–5.11)
RDW: 15.8 % — ABNORMAL HIGH (ref 11.5–15.5)
WBC: 9.8 10*3/uL (ref 4.0–10.5)

## 2016-07-19 LAB — COMPREHENSIVE METABOLIC PANEL
ALT: 23 U/L (ref 14–54)
AST: 19 U/L (ref 15–41)
Albumin: 4 g/dL (ref 3.5–5.0)
Alkaline Phosphatase: 58 U/L (ref 38–126)
Anion gap: 6 (ref 5–15)
BUN: 16 mg/dL (ref 6–20)
CO2: 24 mmol/L (ref 22–32)
Calcium: 8.8 mg/dL — ABNORMAL LOW (ref 8.9–10.3)
Chloride: 105 mmol/L (ref 101–111)
Creatinine, Ser: 1.27 mg/dL — ABNORMAL HIGH (ref 0.44–1.00)
GFR calc Af Amer: >60 mL/min (ref >60)
GFR calc non Af Amer: 52 mL/min — ABNORMAL LOW (ref >60)
Glucose, Bld: 115 mg/dL — ABNORMAL HIGH (ref 65–99)
Potassium: 3.4 mmol/L — ABNORMAL LOW (ref 3.5–5.1)
Sodium: 135 mmol/L (ref 135–145)
Total Bilirubin: 0.4 mg/dL (ref 0.3–1.2)
Total Protein: 7.9 g/dL (ref 6.5–8.1)

## 2016-07-19 LAB — D-DIMER, QUANTITATIVE (NOT AT ARMC): D-Dimer, Quant: 0.68 ug/mL-FEU — ABNORMAL HIGH (ref 0.00–0.50)

## 2016-07-19 MED ORDER — SODIUM CHLORIDE 0.9 % IV BOLUS (SEPSIS)
1000.0000 mL | Freq: Once | INTRAVENOUS | Status: AC
  Administered 2016-07-19: 1000 mL via INTRAVENOUS

## 2016-07-19 MED ORDER — IOPAMIDOL (ISOVUE-370) INJECTION 76%
100.0000 mL | Freq: Once | INTRAVENOUS | Status: AC | PRN
  Administered 2016-07-19: 100 mL via INTRAVENOUS

## 2016-07-19 NOTE — ED Notes (Signed)
Patient transported to CT 

## 2016-07-19 NOTE — ED Provider Notes (Signed)
[facility] DEPT [facility] Provider Note   CSN: [id number] Arrival date & time: 07/19/16  2212  By signing my name below, I, Christine Jordan, attest that this documentation has been prepared under the direction and in the presence of [employer], PA-C. Electronically Signed: Gwenlyn Jordan, ED Scribe. 07/19/16. 10:44 PM.  History   Chief Complaint Chief Complaint  Patient presents with  . Flank Pain   The history is provided by the patient. No language interpreter was used.   HPI Comments:  Christine Jordan is a 41 y.o. female who presents to the Emergency Department complaining of acute onset, constant, moderate sharp, squeezing right back pain for 1 day. Pain self resolved last night, but returned tonight. Pain is exacerbated with deep inhalation. She has tried Ibuprofen with moderate relief to symptoms. She reports associated shortness of breath due to pain. PSHx includes hysterectomy. PMHx includes PE/DVT. She is no longer on blood thinners. Pt does not smoke. Denies hematuria, nausea, vomiting, dysuria, abdominal pain, chest pain, leg swelling or any other complaints at this time.  Past Medical History:  Diagnosis Date  . Anemia   . Headache(784.0)   . Iron deficiency anemia due to chronic blood loss 08/07/2011    Patient Active Problem List   Diagnosis Date Noted  . SOB (shortness of breath) on exertion 04/03/2015  . DVT (deep venous thrombosis), left 04/03/2015  . Dyspnea on exertion   . Fibroids, submucosal 02/16/2015  . Iron deficiency anemia due to chronic blood loss 08/07/2011    Past Surgical History:  Procedure Laterality Date  . MYOMECTOMY  09/15/2011   Procedure: MYOMECTOMY;  Surgeon: Marylynn Pearson, MD;  Location: [facility];  Service: Gynecology;  Laterality: N/A;  abdominal  . NO PAST SURGERIES      OB History    No data available       Home Medications    Prior to Admission medications   Medication Sig Start Date End Date Taking? Authorizing Provider    apixaban (ELIQUIS) 5 MG TABS tablet Take 1 tablet (5 mg total) by mouth 2 (two) times daily. 04/07/15   Donne Hazel, MD  docusate sodium (COLACE) 100 MG capsule Take 1 capsule (100 mg total) by mouth 2 (two) times daily. 04/07/15   Donne Hazel, MD  ferrous sulfate 325 (65 FE) MG tablet Take 1 tablet (325 mg total) by mouth 2 (two) times daily with a meal. 04/07/15   Donne Hazel, MD  furosemide (LASIX) 20 MG tablet Take 1 tablet (20 mg total) by mouth 2 (two) times daily. 04/02/15   Shawnee Knapp, MD  HYDROcodone-acetaminophen (NORCO/VICODIN) 5-325 MG tablet Take 1 tablet by mouth every 6 (six) hours as needed for moderate pain. 01/25/15   Historical Provider, MD  ibuprofen (ADVIL,MOTRIN) 200 MG tablet Take 400 mg by mouth every 6 (six) hours as needed for moderate pain.    Historical Provider, MD  ondansetron (ZOFRAN-ODT) 8 MG disintegrating tablet Take 8 mg by mouth every 8 (eight) hours as needed for nausea or vomiting. 01/25/15   Historical Provider, MD    Family History Family History  Problem Relation Age of Onset  . Anemia Mother   . Diabetes Mother   . Hypertension Mother   . Cancer Father   . Hypertension Brother     Social History Social History  Substance Use Topics  . Smoking status: Never Smoker  . Smokeless tobacco: Never Used  . Alcohol use 0.6 oz/week    1 Glasses  of wine per week     Allergies   Patient has no known allergies.   Review of Systems Review of Systems  Respiratory: Positive for shortness of breath.   Cardiovascular: Negative for chest pain and leg swelling.  Gastrointestinal: Negative for abdominal pain, nausea and vomiting.  Genitourinary: Positive for flank pain. Negative for dysuria and hematuria.  All other systems reviewed and are negative.    Physical Exam Updated Vital Signs BP 142/94 (BP Location: Left Arm)   Pulse 84   Temp 98.4 F (36.9 C) (Oral)   Resp 16   Ht 5' 4"  (1.626 m)   Wt 79.4 kg   LMP 03/07/2015   SpO2 99%    BMI 30.04 kg/m   Physical Exam  Constitutional: She is oriented to person, place, and time. She appears well-developed and well-nourished. She is active. No distress.  HENT:  Head: Normocephalic and atraumatic.  Eyes: Conjunctivae are normal.  Cardiovascular: Normal rate.   Pulmonary/Chest: Effort normal. No respiratory distress.  TTP of right posterior ribs- pain with inspiration   Musculoskeletal: Normal range of motion.  Neurological: She is alert and oriented to person, place, and time.  Skin: Skin is warm and dry.  Psychiatric: She has a normal mood and affect. Her behavior is normal.  Nursing note and vitals reviewed.  ED Treatments / Results  DIAGNOSTIC STUDIES: Oxygen Saturation is 98% on RA, normal by my interpretation.    COORDINATION OF CARE: 10:40 PM Discussed treatment plan with pt at bedside which includes D-Dimer, urinalysis, and basic labs and pt agreed to plan.  Labs (all labs ordered are listed, but only abnormal results are displayed) Labs Reviewed  D-DIMER, QUANTITATIVE (NOT AT [facility]) - Abnormal; Notable for the following:       Result Value   D-Dimer, Quant 0.68 (*)    All other components within normal limits  CBC WITH DIFFERENTIAL/PLATELET - Abnormal; Notable for the following:    Hemoglobin 11.2 (*)    HCT 35.1 (*)    RDW 15.8 (*)    All other components within normal limits  COMPREHENSIVE METABOLIC PANEL - Abnormal; Notable for the following:    Potassium 3.4 (*)    Glucose, Bld 115 (*)    Creatinine, Ser 1.27 (*)    Calcium 8.8 (*)    GFR calc non Af Amer 52 (*)    All other components within normal limits  URINALYSIS, ROUTINE W REFLEX MICROSCOPIC - Abnormal; Notable for the following:    Specific Gravity, Urine >1.046 (*)    Leukocytes, UA SMALL (*)    All other components within normal limits  URINALYSIS, MICROSCOPIC (REFLEX) - Abnormal; Notable for the following:    Bacteria, UA RARE (*)    Squamous Epithelial / LPF 0-5 (*)    All other  components within normal limits    EKG  EKG Interpretation None       Radiology Ct Angio Chest Pe W And/or Wo Contrast  Result Date: 07/20/2016 CLINICAL DATA:  Right-sided flank pain wrapping around abdomen. EXAM: CT ANGIOGRAPHY CHEST WITH CONTRAST TECHNIQUE: Multidetector CT imaging of the chest was performed using the standard protocol during bolus administration of intravenous contrast. Multiplanar CT image reconstructions and MIPs were obtained to evaluate the vascular anatomy. CONTRAST:  100 cc Isovue 370 IV COMPARISON:  04/03/2015 CT FINDINGS: Cardiovascular: Normal sized cardiac chambers. No significant pericardial effusion or thickening. No acute pulmonary embolus. No aortic dissection or aneurysm. Mediastinum/Nodes: No enlarged mediastinal, hilar, or axillary lymph  nodes. Thyroid gland, trachea, and esophagus demonstrate no significant findings. Small subcentimeter axillary lymph nodes bilaterally. Lungs/Pleura: Lungs are free of pulmonary consolidations, effusions or pneumothoraces. No dominant mass. Minimal dependent atelectasis bilaterally. Upper Abdomen: Stable hypodense left adrenal nodule measuring up to 3.2 cm maximum with Hounsfield units consistent with an adenoma. No acute abnormality of the visualized abdomen. Musculoskeletal: No aggressive lytic or sclerotic bone lesions. Nodular bilateral breast masses are relatively stable since 2016. Patient had mammography and ultrasound of the right breast performed on 06/10/2011. Patient had a mammogram with ultrasound of the right breast suggesting a fibroadenolipoma. Repeat imaging to assure stability would prove useful. Review of the MIP images confirms the above findings. IMPRESSION: 1. No acute pulmonary embolus, aortic dissection or aneurysm. 2. Stable nodular masses in both breasts dating back to 2016. A repeat ultrasound and mammogram may prove useful to assure stability. 3. Stable left adrenal mass measuring up to 3.2 cm consistent  with an adenoma. Electronically Signed   By: Ashley Royalty M.D.   On: 07/20/2016 01:03    Procedures Procedures (including critical care time)  Medications Ordered in ED Medications  sodium chloride 0.9 % bolus 1,000 mL (1,000 mLs Intravenous New Bag/Given 07/19/16 2336)  iopamidol (ISOVUE-370) 76 % injection 100 mL (100 mLs Intravenous Contrast Given 07/19/16 2343)     Initial Impression / Assessment and Plan / ED Course  I have reviewed the triage vital signs and the nursing notes.  Pertinent labs & imaging results that were available during my care of the patient were reviewed by me and considered in my medical decision making (see chart for details).     I personally performed the services described in this documentation, which was scribed in my presence. The recorded information has been reviewed and is accurate.  Final Clinical Impressions(s) / ED Diagnoses   Final diagnoses:  Acute right-sided thoracic back pain  Elevated serum creatinine    Labs: UA, D dimer, CBC,  CMP,   Imaging: CT angio Chest PE  Consults:  Therapeutics:  Discharge Meds:   Assessment/Plan: 41 year old female presents today with right back pain.  Patient has pleuritic pain.  Reports some minor shortness of breath with the pain.  She has a significant past medical history of PE and DVT.  Patient is not currently on anticoagulation.  Her d-dimer is elevated, CT scan shows no signs of PE.  Patient's pain was initially palpable on my exam, worse with deep inspiration.  She is no longer having any pain after CT scan.  Patient's pain is likely muscular in nature worsened with inspiration and movement.  She has no signs of infectious etiology, she has no abdominal pain, no flank pain, no signs or symptoms consistent with urinary tract infection, traction, or renal stone.  Patient does have a slight elevation in her creatinine based on previous which was a year and a half ago.  She is also dehydrated on  laboratory analysis.  Patient will follow up with her primary care provider for repeat creatinine and ongoing management.  She is given strict return precautions, she verbalized understanding and agreement to today's plan and had no further questions or concerns the time discharge.    New Prescriptions New Prescriptions   No medications on file     Okey Regal, PA-C 07/20/16 [place], MD 07/20/16 1435

## 2016-07-19 NOTE — ED Notes (Signed)
Pt returned from CT °

## 2016-07-19 NOTE — ED Triage Notes (Signed)
Pt states onset of right flank pain that sometimes wraps around to her abdomen awoke her from sleep Friday night. The pain went away and then returned tonight. She denies n/v or urinary symptoms

## 2016-07-20 LAB — URINALYSIS, ROUTINE W REFLEX MICROSCOPIC
Bilirubin Urine: NEGATIVE
Glucose, UA: NEGATIVE mg/dL
Hgb urine dipstick: NEGATIVE
Ketones, ur: NEGATIVE mg/dL
Nitrite: NEGATIVE
Protein, ur: NEGATIVE mg/dL
Specific Gravity, Urine: >1.046 — ABNORMAL HIGH (ref 1.005–1.030)
pH: 6 (ref 5.0–8.0)

## 2016-07-20 LAB — URINALYSIS, MICROSCOPIC (REFLEX)

## 2016-07-20 NOTE — Discharge Instructions (Signed)
Please read attached information. If you experience any new or worsening signs or symptoms please return to the emergency room for evaluation. Please follow-up with your primary care provider or specialist as discussed, please inform them of all relevant information including your recent kidney function test.

## 2017-02-25 ENCOUNTER — Other Ambulatory Visit: Payer: Self-pay | Admitting: Obstetrics and Gynecology

## 2017-02-25 DIAGNOSIS — N6001 Solitary cyst of right breast: Secondary | ICD-10-CM

## 2017-03-16 ENCOUNTER — Other Ambulatory Visit: Payer: Managed Care, Other (non HMO)

## 2019-10-17 ENCOUNTER — Other Ambulatory Visit: Payer: Self-pay | Admitting: Family Medicine

## 2019-10-17 DIAGNOSIS — D241 Benign neoplasm of right breast: Secondary | ICD-10-CM

## 2019-10-17 DIAGNOSIS — N63 Unspecified lump in unspecified breast: Secondary | ICD-10-CM

## 2019-11-01 ENCOUNTER — Ambulatory Visit
Admission: RE | Admit: 2019-11-01 | Discharge: 2019-11-01 | Disposition: A | Discharge status: Home / Self Care | Payer: Managed Care, Other (non HMO) | Source: Ambulatory Visit | Attending: Family Medicine | Admitting: Family Medicine

## 2019-11-01 ENCOUNTER — Other Ambulatory Visit: Payer: Self-pay

## 2019-11-01 DIAGNOSIS — N63 Unspecified lump in unspecified breast: Secondary | ICD-10-CM

## 2019-11-01 DIAGNOSIS — D241 Benign neoplasm of right breast: Secondary | ICD-10-CM

## 2021-01-22 IMAGING — US US BREAST*R* LIMITED INC AXILLA
1 series · 5 of 5 positions shown · non-contrast
Comparison: BILATERAL mammography and RIGHT breast ultrasound
06/10/2011.

CLINICAL DATA: Interval follow-up of a likely benign mass involving
the UPPER OUTER QUADRANT of the RIGHT breast, possibly a
fibroadenoma, identified on baseline mammography in 3622. The
patient returns now for follow-up. Annual evaluation, LEFT breast.

EXAM:
DIGITAL DIAGNOSTIC BILATERAL MAMMOGRAM WITH CAD AND TOMO
ULTRASOUND RIGHT BREAST

[Series 1: us breast*right* limited inc axilla · 0.06mm/px · 5 of 5 slices shown]
[im 1/5]
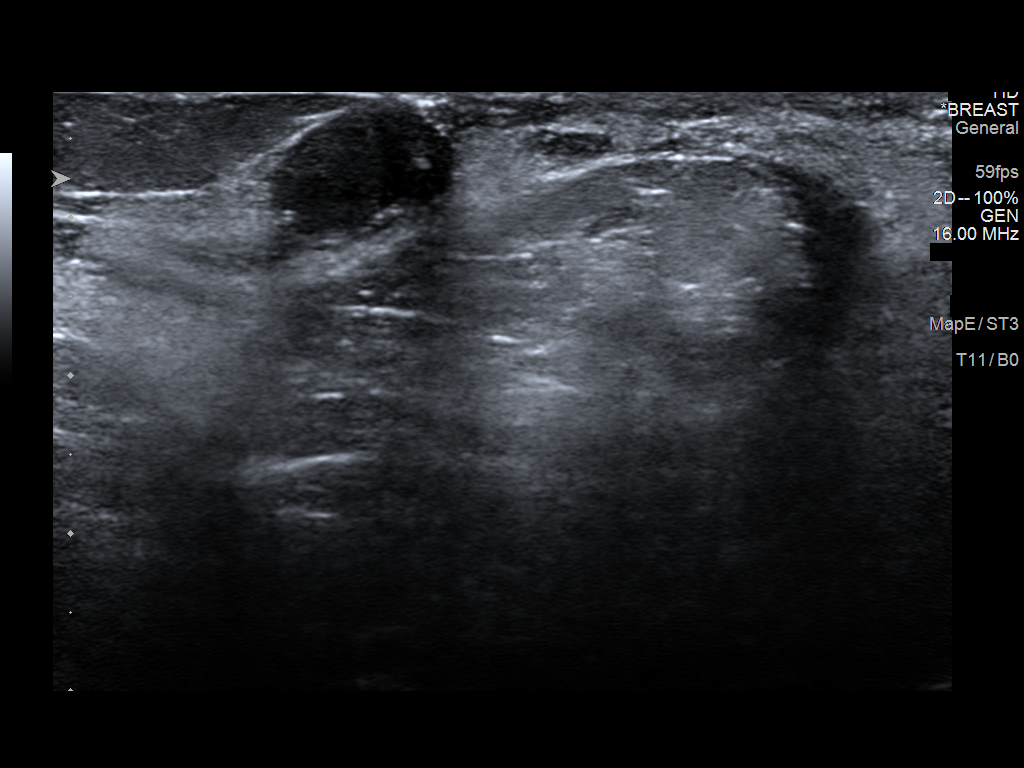
[im 2/5]
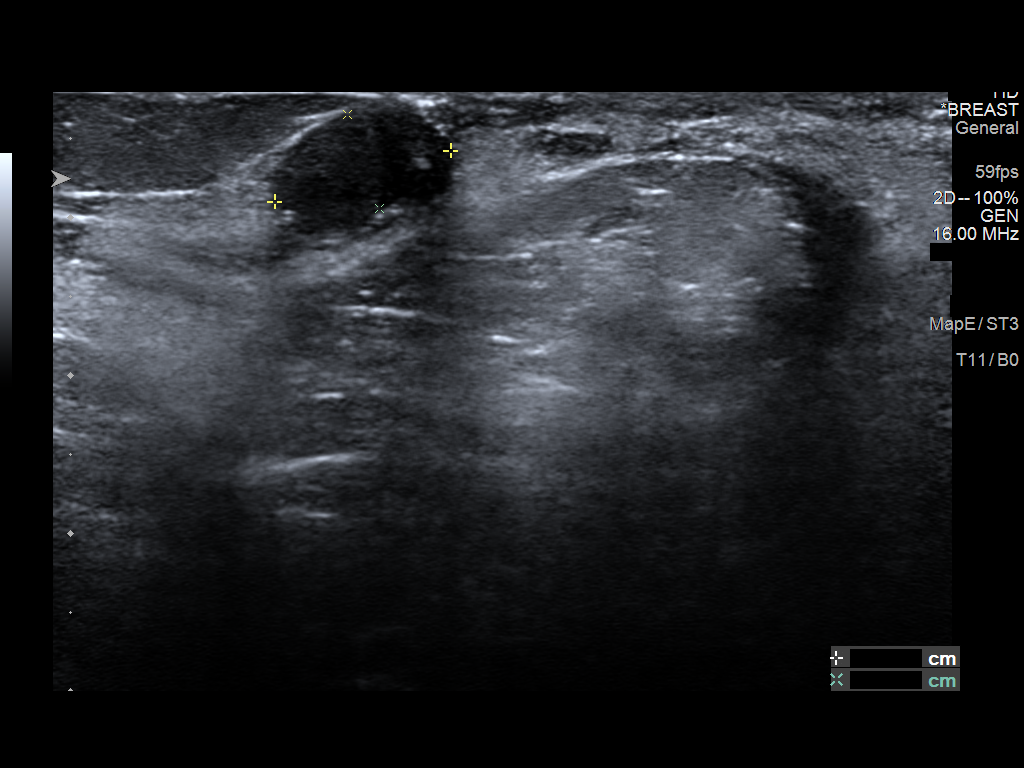
[im 3/5]
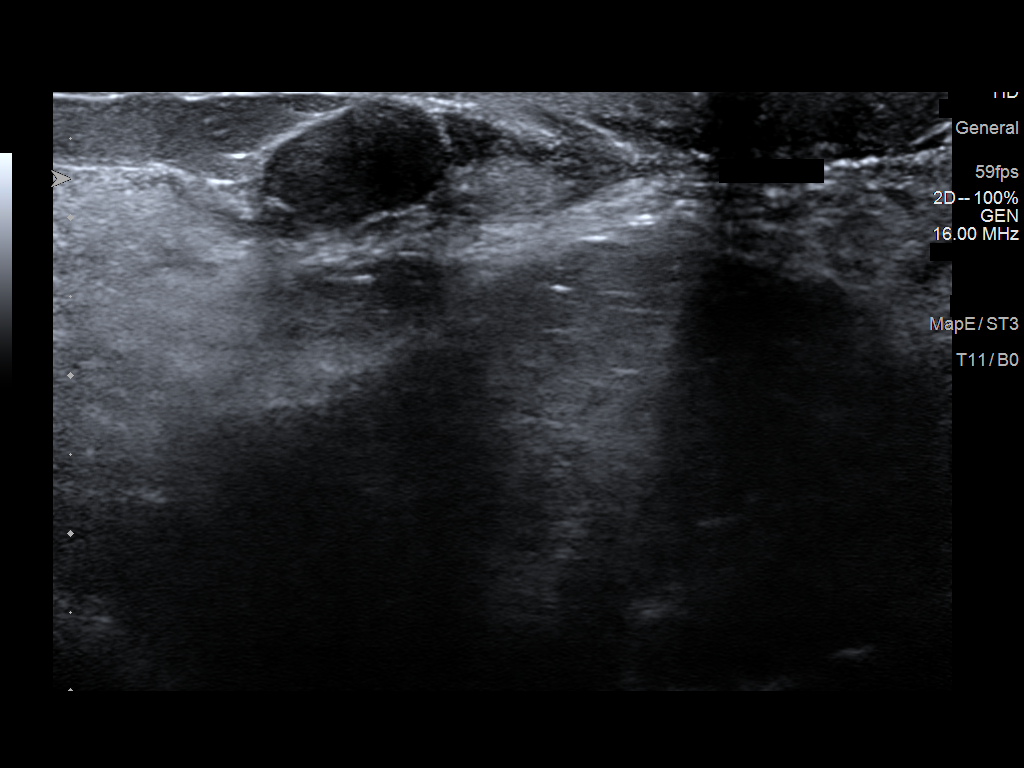
[im 4/5]
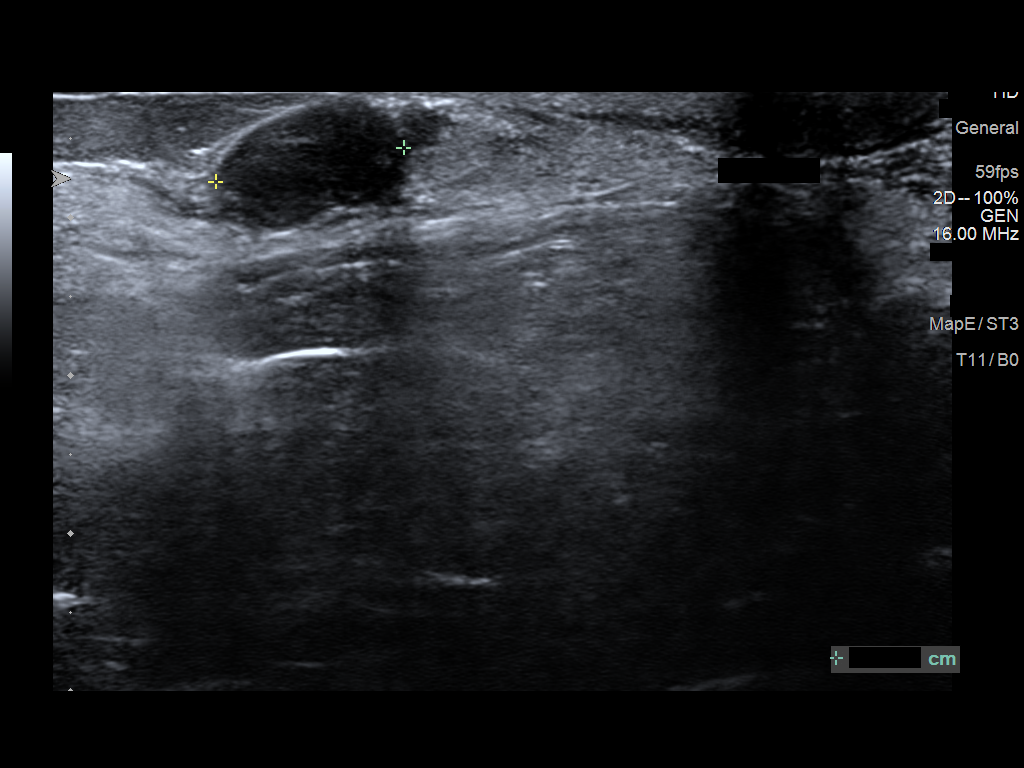
[im 5/5]
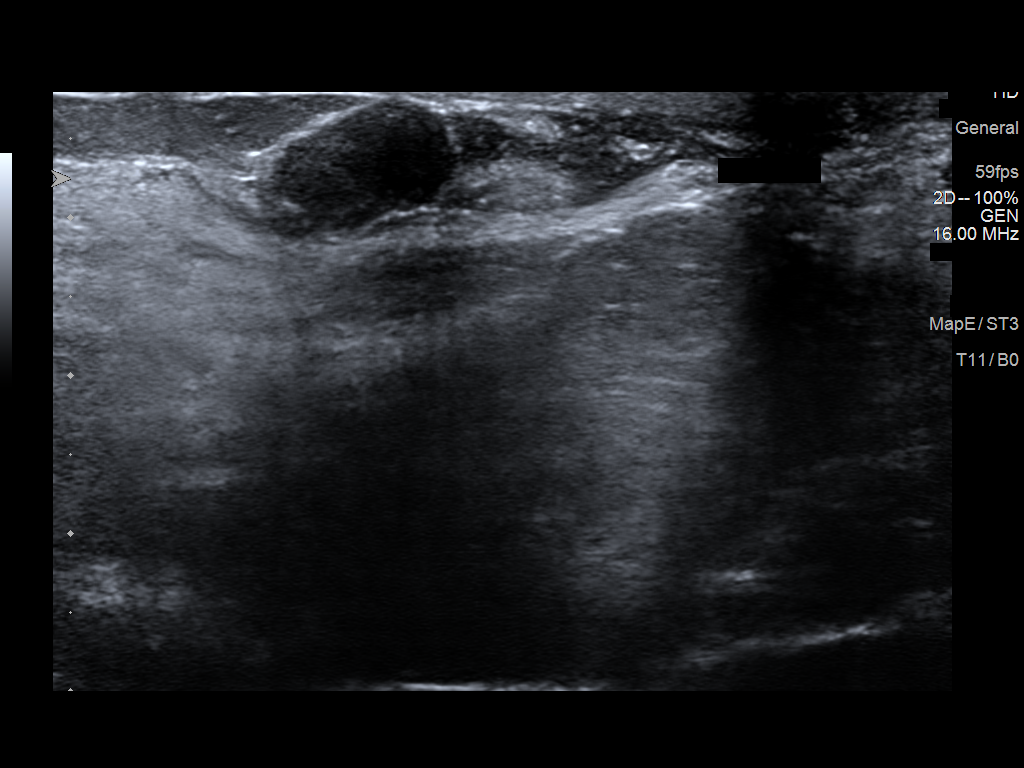

[5 of 5 positions shown; findings below may reference images not displayed]

No interval imaging.

ACR Breast Density Category b: There are scattered areas of
fibroglandular density.
FINDINGS: Tomosynthesis and synthesized full field CC and MLO views of both
breasts were obtained.

The previously identified benign fibroadenolipoma (hamartoma)
measuring approximately 11 cm in the UPPER RIGHT breast is
unchanged. The previously identified likely benign fibroadenoma in
the UPPER OUTER QUADRANT is obscured by the patient's glandular
tissue and the hamartoma, though there are benign appearing
calcifications in the UPPER OUTER QUADRANT which are likely
associated with the presumed fibroadenoma. No new or suspicious
findings elsewhere in the RIGHT breast.

No findings suspicious for malignancy in the LEFT breast.

Mammographic images were processed with CAD.

Targeted RIGHT breast ultrasound is performed, showing the
previously identified circumscribed oval parallel hypoechoic mass
superficially at the 11 o'clock position approximately 1 cm from the
nipple measuring approximately 1.2 x 0.6 x 1.2 cm (previously 1.2 x
0.7 x 1.1 cm on the 06/10/2011 ultrasound), demonstrating no
posterior characteristics and containing calcifications. No new
suspicious solid mass is identified.
IMPRESSION: 1. No mammographic or sonographic evidence of malignancy involving
the RIGHT breast.
2. No mammographic evidence of malignancy involving the LEFT breast.
3. Benign 1.2 cm fibroadenoma superficially in the UPPER OUTER
periareolar RIGHT breast and benign hamartoma involving the UPPER
RIGHT breast, both stable dating back to 3622.

RECOMMENDATION:
Screening mammogram in one year.(Code:N4-8-SWX)

I have discussed the findings and recommendations with the patient.
If applicable, a reminder letter will be sent to the patient
regarding the next appointment.

BI-RADS CATEGORY  2: Benign.

## 2021-01-22 IMAGING — MG DIGITAL DIAGNOSTIC BILAT W/ TOMO W/ CAD
8 series · 8 of 24 positions shown · non-contrast
Comparison: BILATERAL mammography and RIGHT breast ultrasound
06/10/2011.

CLINICAL DATA: Interval follow-up of a likely benign mass involving
the UPPER OUTER QUADRANT of the RIGHT breast, possibly a
fibroadenoma, identified on baseline mammography in 3622. The
patient returns now for follow-up. Annual evaluation, LEFT breast.

EXAM:
DIGITAL DIAGNOSTIC BILATERAL MAMMOGRAM WITH CAD AND TOMO
ULTRASOUND RIGHT BREAST

[R MLO synth-2D]
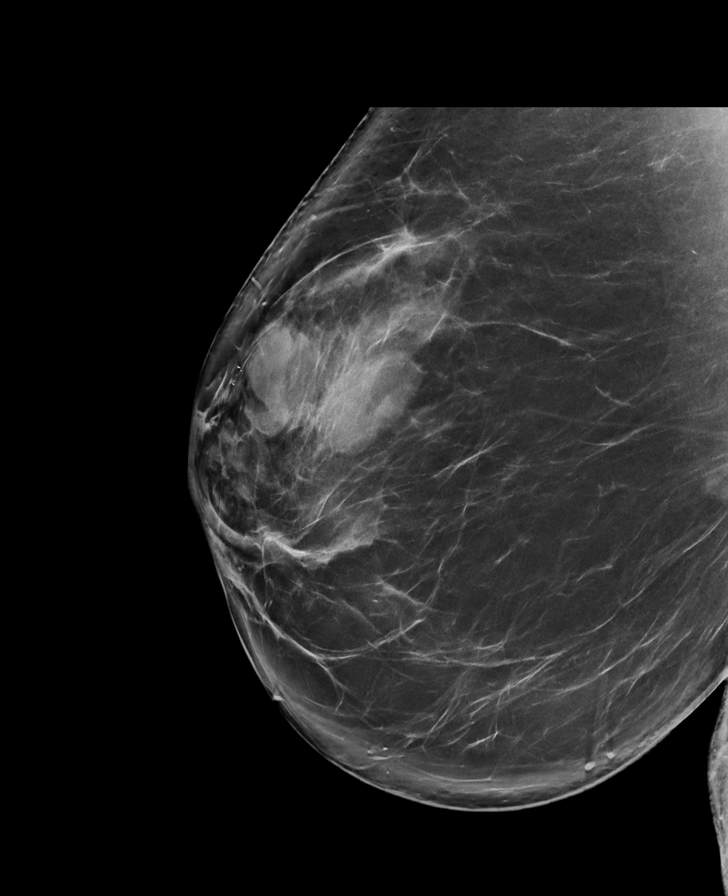

[L CC synth-2D]
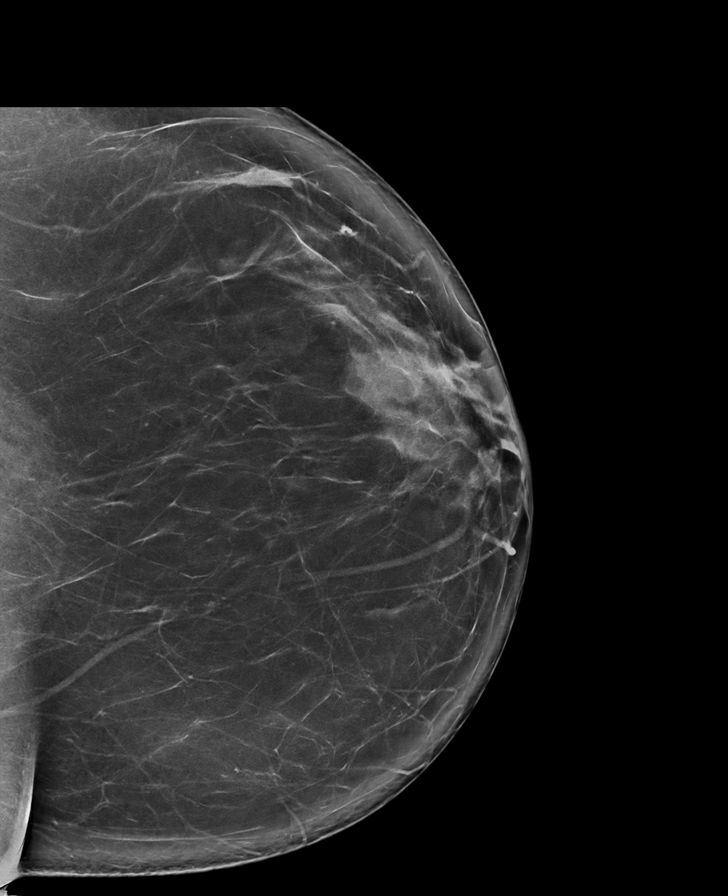

[R CC synth-2D]
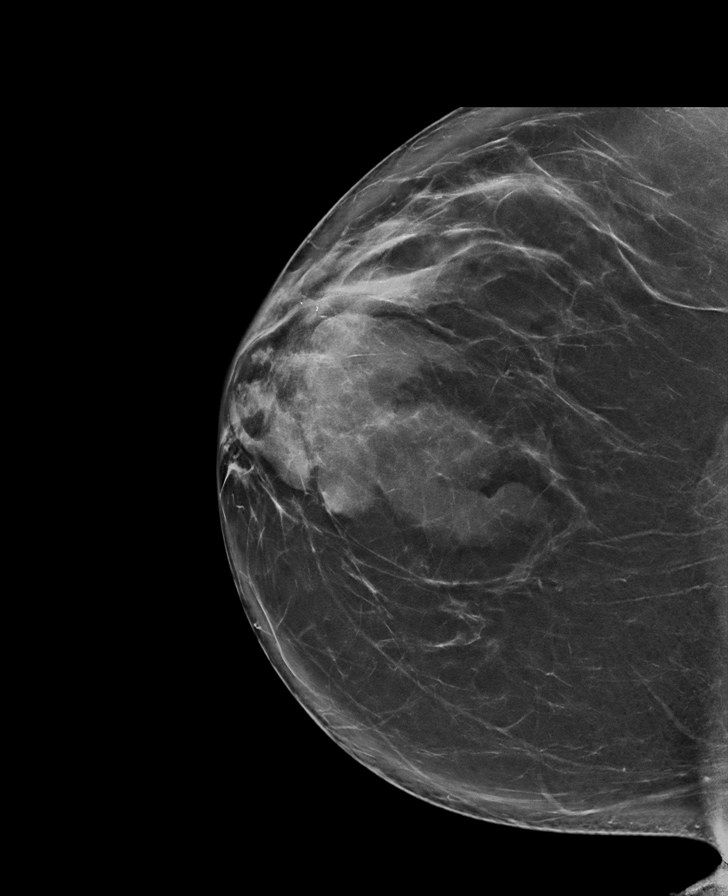

[L MLO synth-2D]
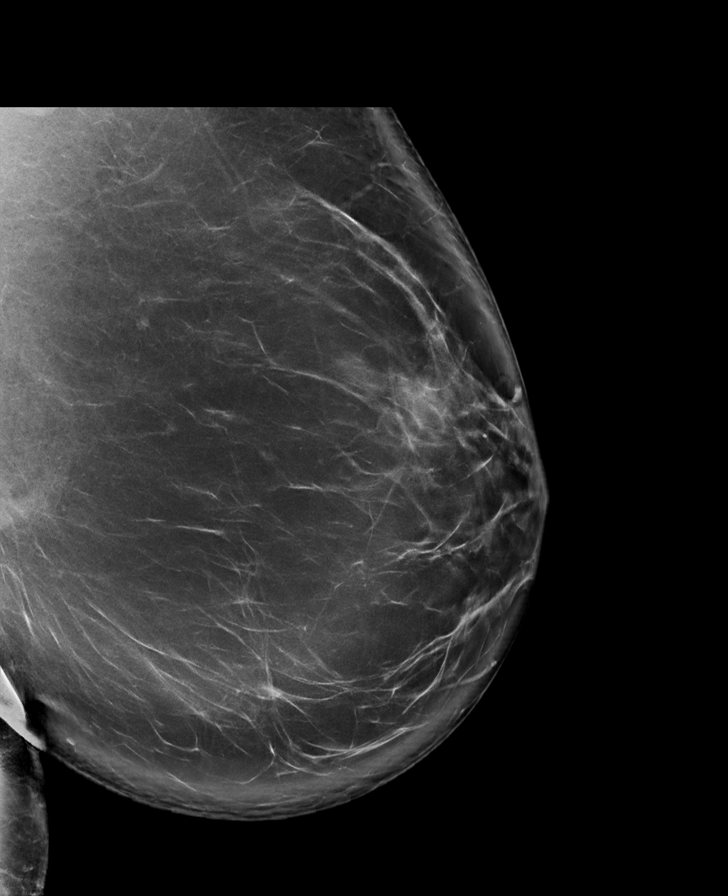

[L MLO tomo · tomo slice 61/121.0]
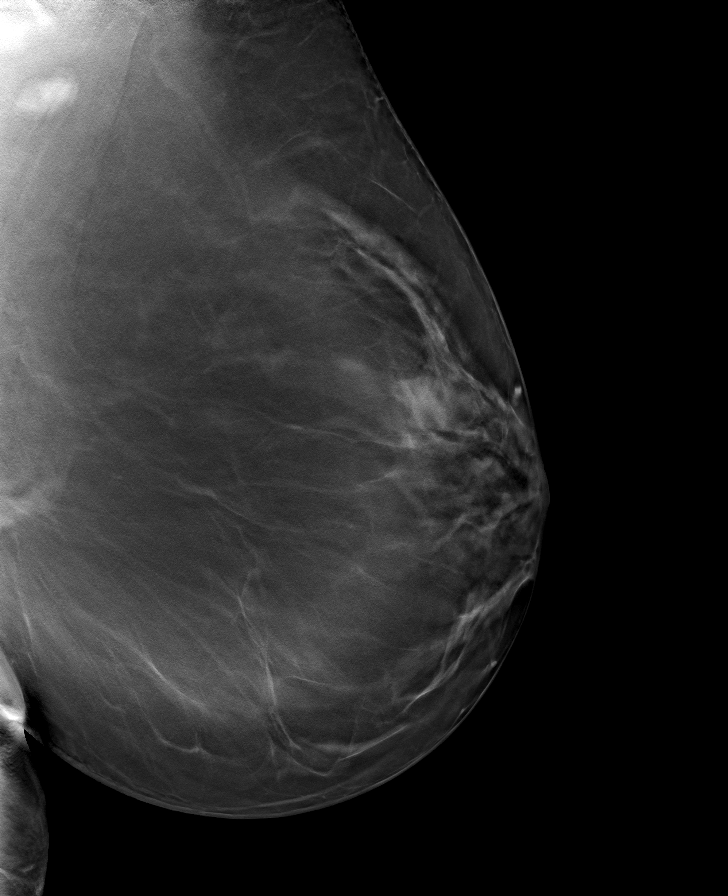

[L CC tomo · tomo slice 53/104.0]
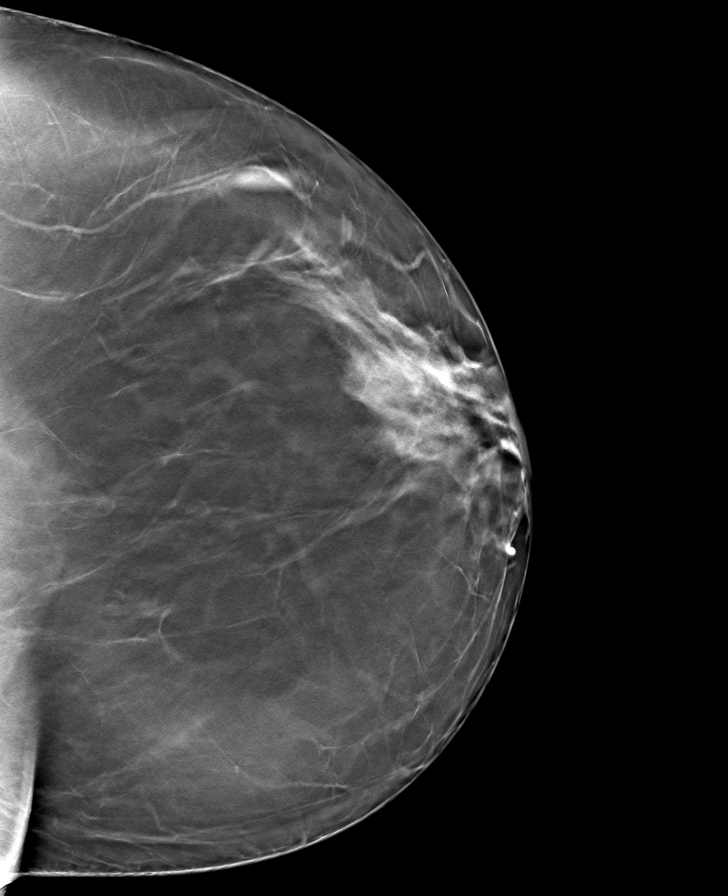

[R CC tomo · tomo slice 51/102.0]
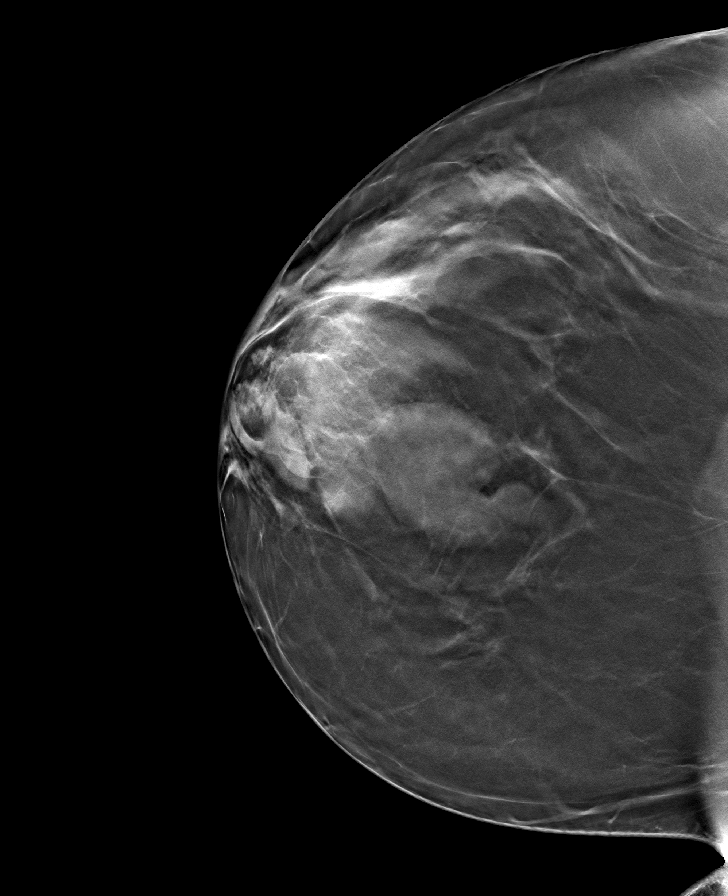

[R MLO tomo · tomo slice 55/110.0]
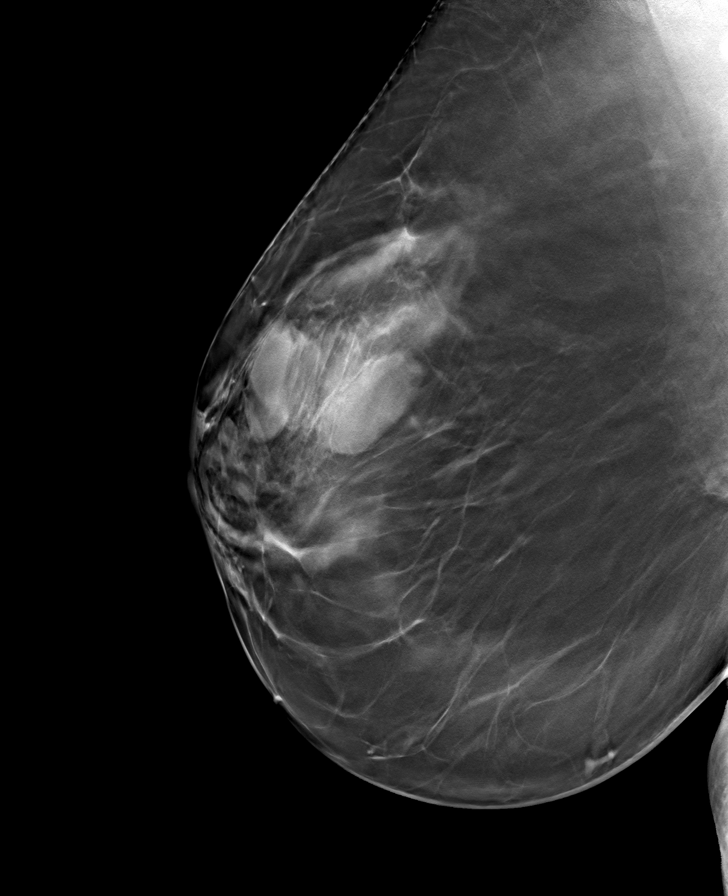

[8 of 24 positions shown; findings below may reference images not displayed]

No interval imaging.

ACR Breast Density Category b: There are scattered areas of
fibroglandular density.
FINDINGS: Tomosynthesis and synthesized full field CC and MLO views of both
breasts were obtained.

The previously identified benign fibroadenolipoma (hamartoma)
measuring approximately 11 cm in the UPPER RIGHT breast is
unchanged. The previously identified likely benign fibroadenoma in
the UPPER OUTER QUADRANT is obscured by the patient's glandular
tissue and the hamartoma, though there are benign appearing
calcifications in the UPPER OUTER QUADRANT which are likely
associated with the presumed fibroadenoma. No new or suspicious
findings elsewhere in the RIGHT breast.

No findings suspicious for malignancy in the LEFT breast.

Mammographic images were processed with CAD.

Targeted RIGHT breast ultrasound is performed, showing the
previously identified circumscribed oval parallel hypoechoic mass
superficially at the 11 o'clock position approximately 1 cm from the
nipple measuring approximately 1.2 x 0.6 x 1.2 cm (previously 1.2 x
0.7 x 1.1 cm on the 06/10/2011 ultrasound), demonstrating no
posterior characteristics and containing calcifications. No new
suspicious solid mass is identified.
IMPRESSION: 1. No mammographic or sonographic evidence of malignancy involving
the RIGHT breast.
2. No mammographic evidence of malignancy involving the LEFT breast.
3. Benign 1.2 cm fibroadenoma superficially in the UPPER OUTER
periareolar RIGHT breast and benign hamartoma involving the UPPER
RIGHT breast, both stable dating back to 3622.

RECOMMENDATION:
Screening mammogram in one year.(Code:[id number])

I have discussed the findings and recommendations with the patient.
If applicable, a reminder letter will be sent to the patient
regarding the next appointment.

BI-RADS CATEGORY  2: Benign.

## 2023-02-04 ENCOUNTER — Other Ambulatory Visit: Payer: Self-pay | Admitting: Family Medicine

## 2023-02-04 DIAGNOSIS — Z1231 Encounter for screening mammogram for malignant neoplasm of breast: Secondary | ICD-10-CM

## 2023-02-24 ENCOUNTER — Ambulatory Visit
Admission: RE | Admit: 2023-02-24 | Discharge: 2023-02-24 | Disposition: A | Discharge status: Home / Self Care | Payer: Managed Care, Other (non HMO) | Source: Ambulatory Visit | Attending: Family Medicine | Admitting: Family Medicine

## 2023-02-24 DIAGNOSIS — Z1231 Encounter for screening mammogram for malignant neoplasm of breast: Secondary | ICD-10-CM
